# Patient Record
Sex: Male | Born: 1985
Health system: Southern US, Community
[De-identification: ages and names within clinical notes are randomized; demographics above are authoritative.]

## PROBLEM LIST (undated history)

## (undated) DIAGNOSIS — F319 Bipolar disorder, unspecified: Secondary | ICD-10-CM

## (undated) DIAGNOSIS — R51 Headache: Secondary | ICD-10-CM

## (undated) DIAGNOSIS — F419 Anxiety disorder, unspecified: Secondary | ICD-10-CM

## (undated) DIAGNOSIS — K118 Other diseases of salivary glands: Secondary | ICD-10-CM

## (undated) DIAGNOSIS — F429 Obsessive-compulsive disorder, unspecified: Secondary | ICD-10-CM

## (undated) DIAGNOSIS — L723 Sebaceous cyst: Secondary | ICD-10-CM

## (undated) DIAGNOSIS — F32A Depression, unspecified: Secondary | ICD-10-CM

## (undated) DIAGNOSIS — F329 Major depressive disorder, single episode, unspecified: Secondary | ICD-10-CM

## (undated) HISTORY — DX: Sebaceous cyst: L72.3

## (undated) HISTORY — PX: WISDOM TOOTH EXTRACTION: SHX21

## (undated) HISTORY — PX: OTHER SURGICAL HISTORY: SHX169

## (undated) HISTORY — DX: Headache: R51

## (undated) HISTORY — DX: Other diseases of salivary glands: K11.8

---

## 2004-01-04 ENCOUNTER — Ambulatory Visit (HOSPITAL_BASED_OUTPATIENT_CLINIC_OR_DEPARTMENT_OTHER): Admission: RE | Admit: 2004-01-04 | Discharge: 2004-01-04 | Payer: Self-pay | Admitting: Emergency Medicine

## 2012-01-06 ENCOUNTER — Ambulatory Visit (INDEPENDENT_AMBULATORY_CARE_PROVIDER_SITE_OTHER): Payer: PRIVATE HEALTH INSURANCE | Admitting: Family Medicine

## 2012-01-06 VITALS — BP 127/86 | HR 67 | Temp 97.5°F | Resp 16 | Ht 71.0 in | Wt 183.0 lb

## 2012-01-06 DIAGNOSIS — R1110 Vomiting, unspecified: Secondary | ICD-10-CM

## 2012-01-06 DIAGNOSIS — M542 Cervicalgia: Secondary | ICD-10-CM

## 2012-01-06 DIAGNOSIS — G43709 Chronic migraine without aura, not intractable, without status migrainosus: Secondary | ICD-10-CM

## 2012-01-06 LAB — POCT CBC
Granulocyte percent: 77.1 %G (ref 37–80)
HCT, POC: 49.5 % (ref 43.5–53.7)
Hemoglobin: 16.9 g/dL (ref 14.1–18.1)
Lymph, poc: 1.7 (ref 0.6–3.4)
MCH, POC: 30.5 pg (ref 27–31.2)
MCHC: 34.1 g/dL (ref 31.8–35.4)
MCV: 89.3 fL (ref 80–97)
MID (cbc): 0.3 (ref 0–0.9)
MPV: 10.3 fL (ref 0–99.8)
POC Granulocyte: 6.8 (ref 2–6.9)
POC LYMPH PERCENT: 19.2 %L (ref 10–50)
POC MID %: 3.7 %M (ref 0–12)
Platelet Count, POC: 289 10*3/uL (ref 142–424)
RBC: 5.54 M/uL (ref 4.69–6.13)
RDW, POC: 13.1 %
WBC: 8.8 10*3/uL (ref 4.6–10.2)

## 2012-01-06 LAB — COMPREHENSIVE METABOLIC PANEL
ALT: 26 U/L (ref 0–53)
AST: 22 U/L (ref 0–37)
Albumin: 4.9 g/dL (ref 3.5–5.2)
Alkaline Phosphatase: 85 U/L (ref 39–117)
BUN: 9 mg/dL (ref 6–23)
CO2: 26 mEq/L (ref 19–32)
Calcium: 9.8 mg/dL (ref 8.4–10.5)
Chloride: 106 mEq/L (ref 96–112)
Creat: 0.85 mg/dL (ref 0.50–1.35)
Glucose, Bld: 107 mg/dL — ABNORMAL HIGH (ref 70–99)
Potassium: 4 mEq/L (ref 3.5–5.3)
Sodium: 141 mEq/L (ref 135–145)
Total Bilirubin: 0.8 mg/dL (ref 0.3–1.2)
Total Protein: 7.5 g/dL (ref 6.0–8.3)

## 2012-01-06 LAB — POCT SEDIMENTATION RATE: POCT SED RATE: 5 mm/hr (ref 0–22)

## 2012-01-06 MED ORDER — SODIUM CHLORIDE 0.9 % IV SOLN: 4.0000 mg | Freq: Once | INTRAVENOUS | Status: DC

## 2012-01-06 MED ORDER — CYCLOBENZAPRINE HCL 10 MG PO TABS: 10.0000 mg | ORAL_TABLET | Freq: Three times a day (TID) | ORAL | Status: AC | PRN

## 2012-01-06 MED ORDER — ONDANSETRON 8 MG PO TBDP: 8.0000 mg | ORAL_TABLET | Freq: Three times a day (TID) | ORAL | Status: AC | PRN

## 2012-01-06 MED ORDER — KETOROLAC TROMETHAMINE 60 MG/2ML IM SOLN
60.0000 mg | Freq: Once | INTRAMUSCULAR | Status: AC
  Administered 2012-01-06: 60 mg via INTRAMUSCULAR

## 2012-01-06 NOTE — Progress Notes (Signed)
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 He comes in with his wife.  Patient has complaints of vomiting following an acute left neck pain. This began at 4:30 this morning. He's had this problem before beginning about 2 years ago. Last year occurred about every other month and he saw an Asian neurologist at [facility] who said there was nothing but muscle relaxers to be done for this. He's never had any imaging of the area.  Patient denies fever or stiff neck. Nevertheless he's had intractable vomiting every 15-20 minutes since 4:30 AM today. He's able to move his neck reasonably well but it's very sore in the left trapezius area. He says he has some ringing in his ears occasionally but no pain. He's had no loss of hearing. He's had no diplopia.  Objective: Patient appears acutely ill but is alert and cooperative.  Neck is supple without adenopathy or thyromegaly.  Head and neck exam: Large cystic structure anterior to the left tragus. Oropharynx clear, pupils equal and reactive to light and accommodation, TMs are normal and extraocular motion is intact.  Chest is clear  Heart: Regular no murmur  Abdomen: Soft nontender  Neurological: Mental status normal, cranial nerves III through XII intact  Patient moving 4 extremities equally  Skin: No rashes noted  Extremities: No deformities and no edema  The patient improved significantly during this visit after being given Toradol, Zofran, and 2 L of IV fluids. Observed for over an hour.  Assessment: Traumatic left neck pain with vomiting syndrome which has been quite troublesome in the past but fortunately not recurring this year until now. This may represent a migraine, but it's unclear if there may be a neck problem as well.  Plan:  Follow up to evaluate for neck problems Flexeril prn, zofran prn

## 2012-01-06 NOTE — Patient Instructions (Signed)
Nausea and Vomiting  Nausea is a sick feeling that often comes before throwing up (vomiting). Vomiting is a reflex where stomach contents come out of your mouth. Vomiting can cause severe loss of body fluids (dehydration). Children and elderly adults can become dehydrated quickly, especially if they also have diarrhea. Nausea and vomiting are symptoms of a condition or disease. It is important to find the cause of your symptoms.  CAUSES    Direct irritation of the stomach lining. This irritation can result from increased acid production (gastroesophageal reflux disease), infection, food poisoning, taking certain medicines (such as nonsteroidal anti-inflammatory drugs), alcohol use, or tobacco use.   Signals from the brain.These signals could be caused by a headache, heat exposure, an inner ear disturbance, increased pressure in the brain from injury, infection, a tumor, or a concussion, pain, emotional stimulus, or metabolic problems.   An obstruction in the gastrointestinal tract (bowel obstruction).   Illnesses such as diabetes, hepatitis, gallbladder problems, appendicitis, kidney problems, cancer, sepsis, atypical symptoms of a heart attack, or eating disorders.   Medical treatments such as chemotherapy and radiation.   Receiving medicine that makes you sleep (general anesthetic) during surgery.  DIAGNOSIS  Your caregiver may ask for tests to be done if the problems do not improve after a few days. Tests may also be done if symptoms are severe or if the reason for the nausea and vomiting is not clear. Tests may include:   Urine tests.   Blood tests.   Stool tests.   Cultures (to look for evidence of infection).   X-rays or other imaging studies.  Test results can help your caregiver make decisions about treatment or the need for additional tests.  TREATMENT  You need to stay well hydrated. Drink frequently but in small amounts.You may wish to drink water, sports drinks, clear broth, or eat frozen  ice pops or gelatin dessert to help stay hydrated.When you eat, eating slowly may help prevent nausea.There are also some antinausea medicines that may help prevent nausea.  HOME CARE INSTRUCTIONS    Take all medicine as directed by your caregiver.   If you do not have an appetite, do not force yourself to eat. However, you must continue to drink fluids.   If you have an appetite, eat a normal diet unless your caregiver tells you differently.   Eat a variety of complex carbohydrates (rice, wheat, potatoes, bread), lean meats, yogurt, fruits, and vegetables.   Avoid high-fat foods because they are more difficult to digest.   Drink enough water and fluids to keep your urine clear or pale yellow.   If you are dehydrated, ask your caregiver for specific rehydration instructions. Signs of dehydration may include:   Severe thirst.   Dry lips and mouth.   Dizziness.   Dark urine.   Decreasing urine frequency and amount.   Confusion.   Rapid breathing or pulse.  SEEK IMMEDIATE MEDICAL CARE IF:    You have blood or brown flecks (like coffee grounds) in your vomit.   You have black or bloody stools.   You have a severe headache or stiff neck.   You are confused.   You have severe abdominal pain.   You have chest pain or trouble breathing.   You do not urinate at least once every 8 hours.   You develop cold or clammy skin.   You continue to vomit for longer than 24 to 48 hours.   You have a fever.  MAKE SURE YOU:      Understand these instructions.   Will watch your condition.   Will get help right away if you are not doing well or get worse.  Document Released: 08/03/2005 Document Revised: 07/23/2011 Document Reviewed: 12/31/2010  [employer] Patient Information 2012 [employer].

## 2012-01-07 ENCOUNTER — Encounter: Payer: Self-pay | Admitting: *Deleted

## 2012-01-07 NOTE — Progress Notes (Signed)
Pt requests to walk in next week to see Dr. Elbert Ewings.

## 2012-03-15 ENCOUNTER — Encounter: Payer: Self-pay | Admitting: Family Medicine

## 2012-03-15 ENCOUNTER — Ambulatory Visit: Payer: PRIVATE HEALTH INSURANCE

## 2012-03-15 ENCOUNTER — Ambulatory Visit (INDEPENDENT_AMBULATORY_CARE_PROVIDER_SITE_OTHER): Payer: PRIVATE HEALTH INSURANCE | Admitting: Family Medicine

## 2012-03-15 VITALS — BP 120/80 | HR 60 | Temp 98.7°F | Resp 16 | Ht 70.0 in | Wt 188.0 lb

## 2012-03-15 DIAGNOSIS — M531 Cervicobrachial syndrome: Secondary | ICD-10-CM

## 2012-03-15 DIAGNOSIS — M5481 Occipital neuralgia: Secondary | ICD-10-CM

## 2012-03-15 DIAGNOSIS — R112 Nausea with vomiting, unspecified: Secondary | ICD-10-CM

## 2012-03-15 DIAGNOSIS — L723 Sebaceous cyst: Secondary | ICD-10-CM

## 2012-03-15 LAB — POCT SEDIMENTATION RATE: POCT SED RATE: 16 mm/hr (ref 0–22)

## 2012-03-15 LAB — CBC WITH DIFFERENTIAL/PLATELET
Basophils Absolute: 0 10*3/uL (ref 0.0–0.1)
Basophils Relative: 1 % (ref 0–1)
Eosinophils Absolute: 0.3 10*3/uL (ref 0.0–0.7)
Eosinophils Relative: 3 % (ref 0–5)
HCT: 44.5 % (ref 39.0–52.0)
Hemoglobin: 16.4 g/dL (ref 13.0–17.0)
Lymphocytes Relative: 37 % (ref 12–46)
Lymphs Abs: 3.3 10*3/uL (ref 0.7–4.0)
MCH: 31 pg (ref 26.0–34.0)
MCHC: 36.9 g/dL — ABNORMAL HIGH (ref 30.0–36.0)
MCV: 84.1 fL (ref 78.0–100.0)
Monocytes Absolute: 0.6 10*3/uL (ref 0.1–1.0)
Monocytes Relative: 7 % (ref 3–12)
Neutro Abs: 4.6 10*3/uL (ref 1.7–7.7)
Neutrophils Relative %: 52 % (ref 43–77)
Platelets: 257 10*3/uL (ref 150–400)
RBC: 5.29 MIL/uL (ref 4.22–5.81)
RDW: 13.2 % (ref 11.5–15.5)
WBC: 8.8 10*3/uL (ref 4.0–10.5)

## 2012-03-15 LAB — COMPREHENSIVE METABOLIC PANEL
ALT: 37 U/L (ref 0–53)
AST: 28 U/L (ref 0–37)
Albumin: 4.8 g/dL (ref 3.5–5.2)
Alkaline Phosphatase: 81 U/L (ref 39–117)
BUN: 10 mg/dL (ref 6–23)
CO2: 30 mEq/L (ref 19–32)
Calcium: 9.7 mg/dL (ref 8.4–10.5)
Chloride: 102 mEq/L (ref 96–112)
Creat: 0.93 mg/dL (ref 0.50–1.35)
Glucose, Bld: 83 mg/dL (ref 70–99)
Potassium: 4 mEq/L (ref 3.5–5.3)
Sodium: 138 mEq/L (ref 135–145)
Total Bilirubin: 0.6 mg/dL (ref 0.3–1.2)
Total Protein: 7.4 g/dL (ref 6.0–8.3)

## 2012-03-15 MED ORDER — CITALOPRAM HYDROBROMIDE 20 MG PO TABS: 20.0000 mg | ORAL_TABLET | Freq: Every day | ORAL | Status: DC

## 2012-03-15 NOTE — Progress Notes (Signed)
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  No h/o trauma.  Also, he awakens with am nausea and some dry heaves most mornings.  He has a lifelong h/o car sickness.  No weight loss.  This nausea doesn't seem to occur as much on Weekends.  Work is very stressful, as he is on productivity.  As a child, he was taking lithium and stopped once he reached the age of maturity.  Also, he has a chronic left preauricular cyst.  It has gradually been enlarging  Objective:  NAD HEENT:  Normal except for the 2 to 3 cm cyst anterior to left tragus Gait: normal Neck:  Normal ROM without adenopathy or pain Neuro: normal mental status (seen with wife), cranial nerves, motor intact [facility] reading (PRIMARY) by  Dr. Milus Glazier c-spine. negative   Assessment:  Significant stress, occipital neuralgia  Plan:   1. Cervico-occipital neuralgia  DG Cervical Spine Complete, citalopram (CELEXA) 20 MG tablet  2. Nausea & vomiting  POCT SEDIMENTATION RATE, Comprehensive metabolic panel, CBC with Differential, citalopram (CELEXA) 20 MG tablet  3. Sebaceous cyst  Ambulatory referral to ENT   Results for orders placed in visit on 03/15/12  POCT SEDIMENTATION RATE      Component Value Range   POCT SED RATE 16  0 - 22 mm/hr  COMPREHENSIVE METABOLIC PANEL      Component Value Range   Sodium 138  135 - 145 mEq/L   Potassium 4.0  3.5 - 5.3 mEq/L   Chloride 102  96 - 112 mEq/L   CO2 30  19 - 32 mEq/L   Glucose, Bld 83  70 - 99 mg/dL   BUN 10  6 - 23 mg/dL   Creat 0.27  2.53 - 6.64 mg/dL   Total Bilirubin 0.6  0.3 - 1.2 mg/dL   Alkaline Phosphatase 81  39 - 117 U/L   AST 28  0 - 37 U/L   ALT 37  0 - 53 U/L   Total Protein 7.4  6.0 - 8.3 g/dL   Albumin 4.8  3.5 - 5.2 g/dL   Calcium 9.7  8.4 - 40.3 mg/dL  CBC WITH DIFFERENTIAL      Component Value Range   WBC 8.8  4.0 - 10.5 K/uL   RBC 5.29  4.22  - 5.81 MIL/uL   Hemoglobin 16.4  13.0 - 17.0 g/dL   HCT 47.4  25.9 - 56.3 %   MCV 84.1  78.0 - 100.0 fL   MCH 31.0  26.0 - 34.0 pg   MCHC 36.9 (*) 30.0 - 36.0 g/dL   RDW 87.5  64.3 - 32.9 %   Platelets 257  150 - 400 K/uL   Neutrophils Relative 52  43 - 77 %   Neutro Abs 4.6  1.7 - 7.7 K/uL   Lymphocytes Relative 37  12 - 46 %   Lymphs Abs 3.3  0.7 - 4.0 K/uL   Monocytes Relative 7  3 - 12 %   Monocytes Absolute 0.6  0.1 - 1.0 K/uL   Eosinophils Relative 3  0 - 5 %   Eosinophils Absolute 0.3  0.0 - 0.7 K/uL   Basophils Relative 1  0 - 1 %   Basophils Absolute 0.0  0.0 - 0.1 K/uL   Smear Review Criteria for review not met

## 2012-03-25 ENCOUNTER — Other Ambulatory Visit: Payer: Self-pay | Admitting: Otolaryngology

## 2012-03-25 DIAGNOSIS — R220 Localized swelling, mass and lump, head: Secondary | ICD-10-CM

## 2012-03-29 ENCOUNTER — Ambulatory Visit
Admission: RE | Admit: 2012-03-29 | Discharge: 2012-03-29 | Disposition: A | Discharge status: Home / Self Care | Payer: PRIVATE HEALTH INSURANCE | Source: Ambulatory Visit | Attending: Otolaryngology | Admitting: Otolaryngology

## 2012-03-29 DIAGNOSIS — R221 Localized swelling, mass and lump, neck: Secondary | ICD-10-CM

## 2012-03-29 MED ORDER — IOHEXOL 300 MG/ML  SOLN
75.0000 mL | Freq: Once | INTRAMUSCULAR | Status: AC | PRN
  Administered 2012-03-29: 75 mL via INTRAVENOUS

## 2012-04-28 ENCOUNTER — Ambulatory Visit (INDEPENDENT_AMBULATORY_CARE_PROVIDER_SITE_OTHER): Payer: PRIVATE HEALTH INSURANCE | Admitting: Family Medicine

## 2012-04-28 ENCOUNTER — Encounter: Payer: Self-pay | Admitting: Family Medicine

## 2012-04-28 VITALS — BP 118/90 | HR 53 | Temp 98.5°F | Resp 16 | Ht 70.0 in | Wt 194.0 lb

## 2012-04-28 DIAGNOSIS — F4541 Pain disorder exclusively related to psychological factors: Secondary | ICD-10-CM

## 2012-04-28 DIAGNOSIS — R51 Headache: Secondary | ICD-10-CM

## 2012-04-28 DIAGNOSIS — G4700 Insomnia, unspecified: Secondary | ICD-10-CM

## 2012-04-28 MED ORDER — TRIAZOLAM 0.25 MG PO TABS: 0.2500 mg | ORAL_TABLET | Freq: Every evening | ORAL | Status: DC | PRN

## 2012-04-28 NOTE — Patient Instructions (Addendum)

## 2012-04-28 NOTE — Progress Notes (Signed)
26 yo with occipital and neck pain, here for follow up.  The citalopram has blunted his libido.  Sleeping better, but not great Works at [employer] of outpatient surgery for pretragal cyst next week  Generally, the headaches have been worse in the winter time.  Main issue now is getting enough sleep.  Objective:  NAD Heent:  Left pretragal cyst, otherwise no problem with hearing, TM's, oroph Eyes:  Normal fundi, EOMI Neck:  Supple, no adenopathy Chest:  Clear Heart:  Regular, no murmur  Assessment:  Insomnia continues to be a problem, headaches under control, about to have surgery for the left pretragal cyst  Plan:  Stop the citalopram and prescribe Halcion 1. Stress headaches  triazolam (HALCION) 0.25 MG tablet  2. Insomnia  triazolam (HALCION) 0.25 MG tablet

## 2012-05-05 ENCOUNTER — Other Ambulatory Visit: Payer: Self-pay | Admitting: Otolaryngology

## 2012-07-10 ENCOUNTER — Encounter: Payer: Self-pay | Admitting: Family Medicine

## 2012-07-10 DIAGNOSIS — L723 Sebaceous cyst: Secondary | ICD-10-CM | POA: Insufficient documentation

## 2012-08-11 ENCOUNTER — Encounter: Payer: Self-pay | Admitting: *Deleted

## 2012-08-29 ENCOUNTER — Telehealth: Payer: Self-pay

## 2012-08-29 NOTE — Telephone Encounter (Signed)
Pt is requesting migraine medicaions - [facility] [address]

## 2012-08-30 MED ORDER — CYCLOBENZAPRINE HCL 10 MG PO TABS: 10.0000 mg | ORAL_TABLET | Freq: Two times a day (BID) | ORAL | Status: AC | PRN

## 2012-08-30 NOTE — Telephone Encounter (Signed)
Spoke to patient he is requesting flexeril for this please advise. States neck pain causing headaches and this medication has been helpful. Taking prn. Pended.

## 2012-08-30 NOTE — Telephone Encounter (Signed)
Sent flexeril

## 2012-08-30 NOTE — Telephone Encounter (Signed)
Notified pt rx was sent in ?

## 2015-02-03 ENCOUNTER — Emergency Department (HOSPITAL_COMMUNITY)
Admission: EM | Admit: 2015-02-03 | Discharge: 2015-02-03 | Disposition: A | Discharge status: Home / Self Care | Payer: 59 | Attending: Emergency Medicine | Admitting: Emergency Medicine

## 2015-02-03 DIAGNOSIS — R252 Cramp and spasm: Secondary | ICD-10-CM | POA: Diagnosis present

## 2015-02-03 DIAGNOSIS — Z79899 Other long term (current) drug therapy: Secondary | ICD-10-CM | POA: Insufficient documentation

## 2015-02-03 DIAGNOSIS — G43009 Migraine without aura, not intractable, without status migrainosus: Secondary | ICD-10-CM

## 2015-02-03 DIAGNOSIS — G43809 Other migraine, not intractable, without status migrainosus: Secondary | ICD-10-CM | POA: Insufficient documentation

## 2015-02-03 DIAGNOSIS — Z87891 Personal history of nicotine dependence: Secondary | ICD-10-CM | POA: Diagnosis not present

## 2015-02-03 DIAGNOSIS — M542 Cervicalgia: Secondary | ICD-10-CM | POA: Insufficient documentation

## 2015-02-03 DIAGNOSIS — Z872 Personal history of diseases of the skin and subcutaneous tissue: Secondary | ICD-10-CM | POA: Diagnosis not present

## 2015-02-03 LAB — I-STAT CHEM 8, ED
BUN: 10 mg/dL (ref 6–20)
Calcium, Ion: 1.19 mmol/L (ref 1.12–1.23)
Chloride: 108 mmol/L (ref 101–111)
Creatinine, Ser: 0.9 mg/dL (ref 0.61–1.24)
Glucose, Bld: 122 mg/dL — ABNORMAL HIGH (ref 65–99)
HEMATOCRIT: 47 % (ref 39.0–52.0)
Hemoglobin: 16 g/dL (ref 13.0–17.0)
Potassium: 3.6 mmol/L (ref 3.5–5.1)
SODIUM: 144 mmol/L (ref 135–145)
TCO2: 23 mmol/L (ref 0–100)

## 2015-02-03 LAB — HEPATIC FUNCTION PANEL
ALT: 19 U/L (ref 17–63)
AST: 23 U/L (ref 15–41)
Albumin: 4.6 g/dL (ref 3.5–5.0)
Alkaline Phosphatase: 80 U/L (ref 38–126)
Bilirubin, Direct: <0.1 mg/dL — ABNORMAL LOW (ref 0.1–0.5)
Total Bilirubin: 0.8 mg/dL (ref 0.3–1.2)
Total Protein: 7.8 g/dL (ref 6.5–8.1)

## 2015-02-03 LAB — CBC WITH DIFFERENTIAL/PLATELET
Basophils Absolute: 0 10*3/uL (ref 0.0–0.1)
Basophils Relative: 0 % (ref 0–1)
Eosinophils Absolute: 0 10*3/uL (ref 0.0–0.7)
Eosinophils Relative: 0 % (ref 0–5)
HCT: 44.7 % (ref 39.0–52.0)
Hemoglobin: 15.7 g/dL (ref 13.0–17.0)
Lymphocytes Relative: 10 % — ABNORMAL LOW (ref 12–46)
Lymphs Abs: 1.4 10*3/uL (ref 0.7–4.0)
MCH: 30.9 pg (ref 26.0–34.0)
MCHC: 35.1 g/dL (ref 30.0–36.0)
MCV: 88 fL (ref 78.0–100.0)
MONOS PCT: 2 % — AB (ref 3–12)
Monocytes Absolute: 0.4 10*3/uL (ref 0.1–1.0)
NEUTROS ABS: 12.9 10*3/uL — AB (ref 1.7–7.7)
NEUTROS PCT: 88 % — AB (ref 43–77)
Platelets: 259 10*3/uL (ref 150–400)
RBC: 5.08 MIL/uL (ref 4.22–5.81)
RDW: 12.4 % (ref 11.5–15.5)
WBC: 14.7 10*3/uL — ABNORMAL HIGH (ref 4.0–10.5)

## 2015-02-03 LAB — I-STAT CG4 LACTIC ACID, ED: LACTIC ACID, VENOUS: 1.76 mmol/L (ref 0.5–2.0)

## 2015-02-03 LAB — CK: CK TOTAL: 116 U/L (ref 49–397)

## 2015-02-03 LAB — LIPASE, BLOOD: LIPASE: 15 U/L — AB (ref 22–51)

## 2015-02-03 MED ORDER — ONDANSETRON HCL 4 MG/2ML IJ SOLN
4.0000 mg | Freq: Once | INTRAMUSCULAR | Status: AC
  Administered 2015-02-03: 4 mg via INTRAVENOUS

## 2015-02-03 MED ORDER — SODIUM CHLORIDE 0.9 % IV BOLUS (SEPSIS)
1000.0000 mL | Freq: Once | INTRAVENOUS | Status: AC
  Administered 2015-02-03: 1000 mL via INTRAVENOUS

## 2015-02-03 MED ORDER — PROCHLORPERAZINE EDISYLATE 5 MG/ML IJ SOLN
10.0000 mg | Freq: Once | INTRAMUSCULAR | Status: AC
Start: 2015-02-03 — End: 2015-02-03
  Administered 2015-02-03: 10 mg via INTRAVENOUS
  Filled 2015-02-03: qty 2

## 2015-02-03 MED ORDER — SODIUM CHLORIDE 0.9 % IV BOLUS (SEPSIS)
1000.0000 mL | Freq: Once | INTRAVENOUS | Status: AC
Start: 2015-02-03 — End: 2015-02-03
  Administered 2015-02-03: 1000 mL via INTRAVENOUS

## 2015-02-03 MED ORDER — KETOROLAC TROMETHAMINE 30 MG/ML IJ SOLN
30.0000 mg | Freq: Once | INTRAMUSCULAR | Status: AC
  Administered 2015-02-03: 30 mg via INTRAVENOUS
  Filled 2015-02-03: qty 1

## 2015-02-03 MED ORDER — LORAZEPAM 2 MG/ML IJ SOLN
1.0000 mg | Freq: Once | INTRAMUSCULAR | Status: AC
  Administered 2015-02-03: 1 mg via INTRAVENOUS
  Filled 2015-02-03: qty 1

## 2015-02-03 MED ORDER — ONDANSETRON HCL 4 MG PO TABS: 4.0000 mg | ORAL_TABLET | Freq: Three times a day (TID) | ORAL | Status: DC | PRN

## 2015-02-03 NOTE — Discharge Instructions (Signed)
Migraine Headache A migraine headache is an intense, throbbing pain on one or both sides of your head. A migraine can last for 30 minutes to several hours. CAUSES  The exact cause of a migraine headache is not always known. However, a migraine may be caused when nerves in the brain become irritated and release chemicals that cause inflammation. This causes pain. Certain things may also trigger migraines, such as:  Alcohol.  Smoking.  Stress.  Menstruation.  Aged cheeses.  Foods or drinks that contain nitrates, glutamate, aspartame, or tyramine.  Lack of sleep.  Chocolate.  Caffeine.  Hunger.  Physical exertion.  Fatigue.  Medicines used to treat chest pain (nitroglycerine), birth control pills, estrogen, and some blood pressure medicines. SIGNS AND SYMPTOMS  Pain on one or both sides of your head.  Pulsating or throbbing pain.  Severe pain that prevents daily activities.  Pain that is aggravated by any physical activity.  Nausea, vomiting, or both.  Dizziness.  Pain with exposure to bright lights, loud noises, or activity.  General sensitivity to bright lights, loud noises, or smells. Before you get a migraine, you may get warning signs that a migraine is coming (aura). An aura may include:  Seeing flashing lights.  Seeing bright spots, halos, or zigzag lines.  Having tunnel vision or blurred vision.  Having feelings of numbness or tingling.  Having trouble talking.  Having muscle weakness. DIAGNOSIS  A migraine headache is often diagnosed based on:  Symptoms.  Physical exam.  A CT scan or MRI of your head. These imaging tests cannot diagnose migraines, but they can help rule out other causes of headaches. TREATMENT Medicines may be given for pain and nausea. Medicines can also be given to help prevent recurrent migraines.  HOME CARE INSTRUCTIONS  Only take over-the-counter or prescription medicines for pain or discomfort as directed by your  health care provider. The use of long-term narcotics is not recommended.  Lie down in a dark, quiet room when you have a migraine.  Keep a journal to find out what may trigger your migraine headaches. For example, write down:  What you eat and drink.  How much sleep you get.  Any change to your diet or medicines.  Limit alcohol consumption.  Quit smoking if you smoke.  Get 7-9 hours of sleep, or as recommended by your health care provider.  Limit stress.  Keep lights dim if bright lights bother you and make your migraines worse. SEEK IMMEDIATE MEDICAL CARE IF:   Your migraine becomes severe.  You have a fever.  You have a stiff neck.  You have vision loss.  You have muscular weakness or loss of muscle control.  You start losing your balance or have trouble walking.  You feel faint or pass out.  You have severe symptoms that are different from your first symptoms. MAKE SURE YOU:   Understand these instructions.  Will watch your condition.  Will get help right away if you are not doing well or get worse. Document Released: 08/03/2005 Document Revised: 12/18/2013 Document Reviewed: 04/10/2013 [facility] Patient Information 2015 [place], [place]. This information is not intended to replace advice given to you by your health care provider. Make sure you discuss any questions you have with your health care provider.   Emergency Department Resource Guide 1) Find a Doctor and Pay Out of Pocket Although you won't have to find out who is covered by your insurance plan, it is a good idea to ask around and get recommendations.  You will then need to call the office and see if the doctor you have chosen will accept you as a new patient and what types of options they offer for patients who are self-pay. Some doctors offer discounts or will set up payment plans for their patients who do not have insurance, but you will need to ask so you aren't surprised when you get to your  appointment.  2) Contact Your Local Health Department Not all health departments have doctors that can see patients for sick visits, but many do, so it is worth a call to see if yours does. If you don't know where your local health department is, you can check in your phone book. The CDC also has a tool to help you locate your state's health department, and many state websites also have listings of all of their local health departments.  3) Find a [facility] If your illness is not likely to be very severe or complicated, you may want to try a walk in clinic. These are popping up all over the country in pharmacies, drugstores, and shopping centers. They're usually staffed by nurse practitioners or physician assistants that have been trained to treat common illnesses and complaints. They're usually fairly quick and inexpensive. However, if you have serious medical issues or chronic medical problems, these are probably not your best option.  No Primary Care Doctor: - Call Health Connect at  [phone] - they can help you locate a primary care doctor that  accepts your insurance, provides certain services, etc. - Physician Referral Service- [phone]  Chronic Pain Problems: Organization         Address  Phone   Notes  [facility]  [phone] Patients need to be referred by their primary care doctor.   Medication Assistance: Organization         Address  Phone   Notes  [facility] Medication [facility] [place] [place], [place] [address] [phone] --Must be a resident of [facility] -- Must have NO insurance coverage whatsoever (no Medicaid/ Medicare, etc.) -- The pt. MUST have a primary care doctor that directs their care regularly and follows them in the community   [facility]  [phone]   [employer]  [phone]    Agencies that provide inexpensive medical care: Organization         Address  Phone   Notes  [place]  [phone]   [facility]    [phone]   [facility] [place], [place] [address] [phone]   [place] [address] [place] [phone]   Planned Parenthood    [phone]   [facility]    [phone]   [facility] [address], [place] Phone:  [phone], Fax:  [phone] Hours of Operation:  9 am - 6 pm, M-F.  Also accepts Medicaid/Medicare and self-pay.  [facility] for [place], [address], [place] Phone: [phone], Fax: [phone]. Hours of Operation:  8:30 am - 5:30 pm, M-F.  Also accepts Medicaid and self-pay.  [facility] [place] [address] [place] Phone: [phone]   [place], [place], [place] [phone], Ext. [phone] Mondays & Thursdays: 7-9 AM.  First 15 patients are seen on a first come,  first serve basis.    [place] Providers:  Organization         Address  Phone   Notes  [facility] [address], [place] [phone] Also accepts self-pay patients.  [facility] [address], [place]  [phone]   [place], [address], [place] [phone]   [facility] [address], [place] [phone]   Lucianne Lei [address], [place]   [phone] Only accepts [place] Access [place] patients after they have their name applied to their card.   Self-Pay (no insurance) in [facility]:  Organization         Address  Phone   Notes  Sickle Cell Patients, [facility] [place] [phone]   [facility] [place] [phone]   [facility]  [place]  [place], [place], [place] [phone]   [facility]/Dr. Osei-Bonsu  [address] [place] or [address], [place] [phone] Phone number for both Center and [place] locations is the same.  [facility] [address], [place] [phone]   [facility] [address] [place] or [address] [phone]   [facility] [address] [place] [phone], phone; [phone], fax Sees patients 1st and 3rd Saturday of every month.  Must not qualify for public or private insurance (i.e. Medicaid, Medicare, [place] Health Choice, Veterans' Benefits)  Household income should be no more than 200% of the poverty level The clinic cannot treat you if you are pregnant or think you are pregnant  Sexually transmitted diseases are not treated at the clinic.    Dental Care: Organization         Address  Phone  Notes  [facility] [place] [phone] Accepts children up to age 34 who are enrolled in [place] or [place]; pregnant women with a Medicaid card; and children who have applied for Medicaid or [place] Health Choice, but were declined, whose parents can pay a reduced fee at time of service.  [facility] Department of [facility]  [address], [place] [phone] Accepts children up to age 53 who are enrolled in [place] or [place]; pregnant women with a Medicaid card; and children who have applied for Medicaid or [place] Health Choice, but were declined, whose parents can pay a reduced fee at time of service.  [place] [facility] Access PRO[facility]  [place] [phone] Patients are seen by appointment only. Walk-ins are not accepted. [place] will see patients 11 years of age and  older. Monday - Tuesday (8am-5pm) Most Wednesdays (8:30-5pm) $30 per visit, cash only  [facility] Adult Dental Access PROGRAM  [address], [facility] [phone] Patients are seen by appointment only. Walk-ins are not accepted. [place] will see patients 53 years of age and older. One Wednesday Evening (Monthly: Volunteer Based).  $30 per visit, cash only  [place]  [phone] for adults; Children under age 84, call Graduate Pediatric Dentistry at [phone]. Children aged 86-14, please  call [phone] to request a pediatric application.  Dental services are provided in all areas of dental care including fillings, crowns and bridges, complete and partial dentures, implants, gum treatment, root canals, and extractions. Preventive care is also provided. Treatment is provided to both adults and children. Patients are selected via a lottery and there is often a waiting list.   [facility] [address], [place]  [phone] [email or web address]   [facility] [address] [place], [place] [phone], Ext. [phone] Second and Fourth Thursday of each month, opens at 6:30 AM; Clinic ends at 9 AM.  Patients are seen on a first-come first-served basis, and a limited number are seen during each clinic.   [facility]  [address] [place] Danker [place], [place] [phone]   Eligibility Requirements You must have lived in [place], [place], or [place] counties for at least the last three months.   You cannot be eligible for state or federal sponsored [employer], including [employer], [place], or [employer].   You generally cannot be eligible for healthcare insurance through your employer.    How to apply: Eligibility screenings are held every Tuesday and Wednesday afternoon from 1:00 pm until 4:00 pm. You do not need an appointment for the interview!  [facility] [address]  [place], [place]   [place]  [place] Department  [place]  [phone]    Behavioral Health Resources in the Community: Intensive Outpatient Programs Organization         Address  Phone  Notes  [place] [employer] [address] [place], [place] [phone]   [facility] [address], [place], [place]   ADS: [facility] [address] [place], [place]   [place] [address]  [place], [place] or [phone]   Substance Abuse Resources Organization         Address  Phone  Notes  Alcohol and Drug Services  [phone]   [place]  [phone]   [facility] [place]   Chinita Pester  [phone]   Residential & Outpatient Substance Abuse Program  [phone]   Psychological Services Organization         Address  Phone  Notes  [facility] [place]  [place]  [phone]   [place] [address], [place] or [phone]    Mobile Crisis Teams Organization         Address  Phone  Notes  [facility], Mobile Crisis Care Unit  [phone]   [facility]  [address] [place], [place]   [place] Levels [address] [place] [address] [place] [phone]    Self-Help/Support Groups Organization         Address  Phone             Notes  [place]. of [place] - variety of support groups  [place] Call for more information  Narcotics Anonymous (NA), [facility] [address], [employer] [place]  2 meetings at this location   Special educational needs teacher         Address  Phone  Notes  [facility] [place],    [place]  [phone]   [facility] ° [address], [place], [place] [phone]   °[facility] [address], [place] [phone] Admissions: 8am-3pm M-F  °[facility] [address]    °[place], [place] [phone]   °[facility] [address], [place], [place] [phone]   °[facility] [address]  °[place], [place] [phone]   °[facility] - Intensive Outpatient [address], [place], [place] [phone]   °[facility] [address]  °[place], [place] 1-877-615-2722 or [phone]   °[facility] ([facility]) [address] [place], [place] [phone] Accepts Medicaid  °[facility] [address]  °[place] 1-800-659-3381 Substance Abuse/Addiction Treatment  ° °[place] Behavioral Health Resources °Organization         Address  Phone  Notes  °[facility]  ([phone]) [phone]   °Julie Brannon, PhD [address] [place], [place]   ([phone]) [phone] or (336) 951-0000   °[facility]   [address] °[place], [place] ([phone]) [phone]   °[facility] [address], [place], [place] ([phone]) [phone] Insurance/Medicaid/sponsorship through [facility]  °[facility] [address]                                    [place], [place] [phone]) [phone] Therapy/tele-psych/case  °[facility] [address]  ° [place], [place] ([phone]) [phone]    °Dr. Arfeen  ([phone]) [phone]   °[facility] [place]  [employer] [place] [facility] 1) [address], [place] °2) [address], [place] °3)  [address], [place] ([phone]) [phone] °(336) [phone] ° °(336) [phone]   °[place] Child Abuse Hotline (336) 342-1394 or (336) 342-3537 (After Hours)    ° ° ° °

## 2015-02-03 NOTE — ED Notes (Addendum)
Muscle spasms in neck, triggered by stress, has been vomiting for a few hours today, d/t muscle spasms. Same has occurred in past. No recent injury.  Has not taken any flexeril today, unable to keep down d/t vomiting

## 2015-02-03 NOTE — ED Notes (Signed)
Whereas when I first saw him he was shaking, as if actively chilling and diaphoretic and a bit pale; he is now resting comfortably.  His skin is normal, warm and dry and he is breathing normally.  His wife is in constant attendance and thanks [place] for our care.

## 2015-02-03 NOTE — ED Notes (Signed)
Pt was able to dangle on the side of the bed then ambulate to the nursing station and back to bed w/o difficulty.  Denies nausea at this time and reports feeling better.

## 2015-02-03 NOTE — ED Provider Notes (Signed)
CSN: [id number]     Arrival date & time 02/03/15  1541 History   First MD Initiated Contact with Patient 02/03/15 1605     Chief Complaint  Patient presents with  . Spasms  . Emesis     (Consider location/radiation/quality/duration/timing/severity/associated sxs/prior Treatment) HPI   29 year old male with history of recurrent headache, neck spasm presenting for evaluation of muscle spasm. History obtained through wife who is at bedside. Per wife, patient has had intermittent episodes of violent muscle spasm usually triggered by stress. This is his third episodes, last episode was 6 months ago. Approximately 3 hours ago patient developed spasm to his left side of neck that has gotten progressively worse. Spasm became violent, he vomited several times and unable to keep this Flexeril down. Patient broke out into a sweat. He did not injure himself in the process. Wife states when he has his last episode he was treated at urgent care with IV medication. Wife felt stress may be related to getting married yesterday.  Denies drug use.  Prior to neck spasm pt has no other sxs including headache, neck pain, cp, sob, abd pain, numbness, weakness or rash.   Past Medical History  Diagnosis Date  . Headache   . Parotid mass   . Sebaceous cyst    Past Surgical History  Procedure Laterality Date  . Excision benign cyst     No family history on file. History  Substance Use Topics  . Smoking status: Former Smoker    Quit date: 01/05/2005  . Smokeless tobacco: Not on file  . Alcohol Use: Not on file    Review of Systems  All other systems reviewed and are negative.     Allergies  Review of patient's allergies indicates no known allergies.  Home Medications   Prior to Admission medications   Medication Sig Start Date End Date Taking? Authorizing Provider  cyclobenzaprine (FLEXERIL) 10 MG tablet Take 1 tablet (10 mg total) by mouth 2 (two) times daily as needed for muscle spasms. 08/30/12   Yes Eleanore Kurtis Bushman, PA-C  levocetirizine (XYZAL) 5 MG tablet Take 5 mg by mouth daily. 12/24/14  Yes Historical Provider, MD  citalopram (CELEXA) 20 MG tablet Take 1 tablet (20 mg total) by mouth daily. 03/15/12 03/15/13  Robyn Haber, MD  triazolam (HALCION) 0.25 MG tablet Take 1 tablet (0.25 mg total) by mouth at bedtime as needed. 04/28/12 05/28/12  Robyn Haber, MD   BP 110/90 mmHg  Pulse 84  Resp 20  SpO2 100% Physical Exam  Constitutional: He is oriented to person, place, and time. He appears well-developed and well-nourished. No distress.  Caucasian male, laying still with eyes closed, diaphoretic.  HENT:  Head: Normocephalic and atraumatic.  Right Ear: External ear normal.  Left Ear: External ear normal.  Mouth/Throat: Oropharynx is clear and moist.  Eyes: Conjunctivae and EOM are normal. Pupils are equal, round, and reactive to light.  Neck: Normal range of motion. Neck supple.  Tenderness to paracervical spine on palpation but no nuchal rigidity.  Cardiovascular: Normal rate, regular rhythm and intact distal pulses.   Pulmonary/Chest: Effort normal and breath sounds normal.  Abdominal: Soft. There is no tenderness.  Musculoskeletal:  Able to move all 4 extremities, normal grip strength.  Neurological: He is alert and oriented to person, place, and time.  Skin: No rash noted.  Psychiatric: He has a normal mood and affect.  Nursing note and vitals reviewed.   ED Course  Procedures (including critical care time)  Patient here with recurrent bouts of neck spasm usually induced by stress. He is diaphoretic, however no nuchal rigidity and has no obvious focal neuro deficit. Given the recurrent of his symptoms, we'll focus on treating his symptoms with Ativan, IV fluid, zofran and will monitor closely.    Doubt meningitis or stroke.  He has history of occipito-cervical neuralgia which sometimes treat his headache. This felt similar to one of his prior episodes.  7:03  PM Wife made mention that pt has been overdoing himself for the past week in preparation for the wedding and she believes he is crashing from it.  Pt appears exhausted.  He report neck soreness and migraine type headache.  He has no nuchal rigidity to suggest meningitis.  I also discussed further work up including labs and LP if no improvement, pt declined.  Did attempt to ambulate pt but he became nauseated.  Will give compazine and monitor closely.    8:57 PM Nausea improves, but pt is still drowsy.  Dr. Maryan Rued has seen and evaluate pt and felt this is likely an atypical migraine since pt has hx of migraine. Plan to give toradol and monitor.    11:10 PM Patient felt much better, able to ambulate without difficulty, no nausea. He felt comfortable going home. Will provide resources for outpatient follow-up.  Return precaution discussed. Labs are otherwise reassuring.    Labs Review Labs Reviewed  CBC WITH DIFFERENTIAL/PLATELET - Abnormal; Notable for the following:    WBC 14.7 (*)    Neutrophils Relative % 88 (*)    Neutro Abs 12.9 (*)    Lymphocytes Relative 10 (*)    Monocytes Relative 2 (*)    All other components within normal limits  LIPASE, BLOOD - Abnormal; Notable for the following:    Lipase 15 (*)    All other components within normal limits  HEPATIC FUNCTION PANEL - Abnormal; Notable for the following:    Bilirubin, Direct <0.1 (*)    All other components within normal limits  I-STAT CHEM 8, ED - Abnormal; Notable for the following:    Glucose, Bld 122 (*)    All other components within normal limits  CK  I-STAT CG4 LACTIC ACID, ED    Imaging Review No results found.   EKG Interpretation None      MDM   Final diagnoses:  Atypical migraine    BP 123/68 mmHg  Pulse 76  Temp(Src) 98.3 F (36.8 C) (Oral)  Resp 16  SpO2 97%  I have reviewed nursing notes and vital signs. I reviewed available ER/hospitalization records through the EMR     Domenic Moras,  PA-C 02/03/15 [place], MD 02/03/15 2333

## 2015-02-03 NOTE — ED Notes (Signed)
Pt refused to walk. Pt. Stated that he could not walk because he was feeling nausea. Nurse was notified.

## 2015-04-05 ENCOUNTER — Ambulatory Visit: Payer: PRIVATE HEALTH INSURANCE | Admitting: Family

## 2015-05-30 ENCOUNTER — Other Ambulatory Visit: Payer: Self-pay | Admitting: Neurology

## 2015-05-30 DIAGNOSIS — R51 Headache: Principal | ICD-10-CM

## 2015-05-30 DIAGNOSIS — R519 Headache, unspecified: Secondary | ICD-10-CM

## 2015-06-14 ENCOUNTER — Ambulatory Visit
Admission: RE | Admit: 2015-06-14 | Discharge: 2015-06-14 | Disposition: A | Discharge status: Home / Self Care | Payer: 59 | Source: Ambulatory Visit | Attending: Neurology | Admitting: Neurology

## 2015-06-14 DIAGNOSIS — R51 Headache: Principal | ICD-10-CM

## 2015-06-14 DIAGNOSIS — R519 Headache, unspecified: Secondary | ICD-10-CM

## 2015-07-05 ENCOUNTER — Other Ambulatory Visit: Payer: Self-pay | Admitting: Neurology

## 2015-07-05 MED ORDER — LEVOCETIRIZINE DIHYDROCHLORIDE 5 MG PO TABS: 5.0000 mg | ORAL_TABLET | Freq: Every day | ORAL | Status: DC

## 2015-09-23 ENCOUNTER — Encounter: Payer: Self-pay | Admitting: Allergy and Immunology

## 2015-09-23 ENCOUNTER — Ambulatory Visit (INDEPENDENT_AMBULATORY_CARE_PROVIDER_SITE_OTHER): Payer: 59 | Admitting: Allergy and Immunology

## 2015-09-23 VITALS — BP 120/90 | HR 65 | Temp 98.5°F | Resp 17

## 2015-09-23 DIAGNOSIS — J4530 Mild persistent asthma, uncomplicated: Secondary | ICD-10-CM | POA: Insufficient documentation

## 2015-09-23 DIAGNOSIS — J3089 Other allergic rhinitis: Secondary | ICD-10-CM

## 2015-09-23 MED ORDER — LEVOCETIRIZINE DIHYDROCHLORIDE 5 MG PO TABS: 5.0000 mg | ORAL_TABLET | Freq: Every day | ORAL | Status: DC

## 2015-09-23 MED ORDER — IPRATROPIUM-ALBUTEROL 0.5-2.5 (3) MG/3ML IN SOLN
3.0000 mL | Freq: Once | RESPIRATORY_TRACT | Status: AC
  Administered 2015-09-23: 3 mL via RESPIRATORY_TRACT

## 2015-09-23 MED ORDER — FLUTICASONE FUROATE 100 MCG/ACT IN AEPB: 1.0000 | INHALATION_SPRAY | Freq: Every day | RESPIRATORY_TRACT | Status: DC

## 2015-09-23 NOTE — Assessment & Plan Note (Signed)
Today's spirometry results, assessed while asymptomatic, suggest under-perception of dyspnea.  He is unable to take montelukast due to side effects.  A prescription has been provided for Arnuity Ellipta (fluticasone furoate) 100 g, one inhalation daily.  Continue albuterol HFA every or to 6 hours as needed.  Subjective and objective measures of pulmonary function will be followed and the treatment plan will be adjusted accordingly.

## 2015-09-23 NOTE — Patient Instructions (Addendum)
Mild persistent asthma Today's spirometry results, assessed while asymptomatic, suggest under-perception of dyspnea.  He is unable to take montelukast due to side effects.  A prescription has been provided for Arnuity Ellipta (fluticasone furoate) 100 g, one inhalation daily.  Continue albuterol HFA every or to 6 hours as needed.  Subjective and objective measures of pulmonary function will be followed and the treatment plan will be adjusted accordingly.  Allergic rhinitis  Continue appropriate allergen avoidance measures.  A refill prescription has been provided for levocetirizine 5 mg daily as needed.    Return in about 8 weeks (around 11/18/2015), or if symptoms worsen or fail to improve.

## 2015-09-23 NOTE — Assessment & Plan Note (Signed)
   Continue appropriate allergen avoidance measures.  A refill prescription has been provided for levocetirizine 5 mg daily as needed.

## 2015-09-23 NOTE — Progress Notes (Signed)
Follow-up Note  RE: BRAYDIN FLAMAND MRN: [id number] DOB: [birth year] Date of Office Visit: 09/23/2015  Primary care provider: Robyn Haber, MD Referring provider: Robyn Haber, MD  History of present illness: HPI Comments: Eric [name] is a 30 y.o. male with asthma and allergic rhinoconjunctivitis who presents today for follow up.  He reports that he discontinued Singulair after having taking it for a few months because he began to feel depressed and was worried that this was a medication side effect.  He reports that his upper and lower rest or symptoms had been well-controlled while on montelukast.  He ran out of levocetirizine approximately 2 or 3 weeks ago and has experienced increased rhinorrhea and nasal congestion.   Assessment and plan: Mild persistent asthma Today's spirometry results, assessed while asymptomatic, suggest under-perception of dyspnea.  He is unable to take montelukast due to side effects.  A prescription has been provided for Arnuity Ellipta (fluticasone furoate) 100 g, one inhalation daily.  Continue albuterol HFA every or to 6 hours as needed.  Subjective and objective measures of pulmonary function will be followed and the treatment plan will be adjusted accordingly.  Allergic rhinitis  Continue appropriate allergen avoidance measures.  A refill prescription has been provided for levocetirizine 5 mg daily as needed.    Meds ordered this encounter  Medications  . ipratropium-albuterol (DUONEB) 0.5-2.5 (3) MG/3ML nebulizer solution 3 mL    Sig:   . levocetirizine (XYZAL) 5 MG tablet    Sig: Take 1 tablet (5 mg total) by mouth daily.    Dispense:  30 tablet    Refill:  4  . Fluticasone Furoate (ARNUITY ELLIPTA) 100 MCG/ACT AEPB    Sig: Inhale 1 puff into the lungs daily.    Dispense:  1 each    Refill:  3    Diagnositics: Spirometry reveals an FVC of 4.96 L (94% predicted) and an FEV1 of 3.16 L (72% predicted) with significant (37%) post  bronchodilator improvement.    Physical examination: Blood pressure 120/90, pulse 65, temperature 98.5 F (36.9 C), temperature source Oral, resp. rate 17.  General: Alert, interactive, in no acute distress. HEENT: TMs pearly gray, turbinates moderately edematous without discharge, post-pharynx moderately erythematous. Neck: Supple without lymphadenopathy. Lungs: Clear to auscultation without wheezing, rhonchi or rales. CV: Normal S1, S2 without murmurs. Skin: Warm and dry, without lesions or rashes.  The following portions of the patient's history were reviewed and updated as appropriate: allergies, current medications, past family history, past medical history, past social history, past surgical history and problem list.    Medication List       This list is accurate as of: 09/23/15  1:57 PM.  Always use your most recent med list.               ALPRAZolam 0.25 MG tablet  Commonly known as:  XANAX  TAKE 1 TABLET ONCE DAILY AS NEEDED FOR ANXIETY     buPROPion 150 MG 24 hr tablet  Commonly known as:  WELLBUTRIN XL  TAKE 1 TABLET ONCE DAILY IN AM     citalopram 20 MG tablet  Commonly known as:  CELEXA  Take 1 tablet (20 mg total) by mouth daily.     cyclobenzaprine 10 MG tablet  Commonly known as:  FLEXERIL  Take 1 tablet (10 mg total) by mouth 2 (two) times daily as needed for muscle spasms.     flurbiprofen 100 MG tablet  Commonly known as:  ANSAID  TAKE  ONE TABLET BY MOUTH TWICE DAILY AS NEEDED FOR HEADACHE OR NECK PAIN. NO MORE THAN 4 DAYS/WEEK     Fluticasone Furoate 100 MCG/ACT Aepb  Commonly known as:  ARNUITY ELLIPTA  Inhale 1 puff into the lungs daily.     levocetirizine 5 MG tablet  Commonly known as:  XYZAL  Take 1 tablet (5 mg total) by mouth daily.     metaxalone 800 MG tablet  Commonly known as:  SKELAXIN  TAKE 1/2 TO 1 TABLET BY MOUTH THREE TIMES DAILY AS NEEDED FOR MUSCLE TIGHTNESS     montelukast 10 MG tablet  Commonly known as:  SINGULAIR    Reported on 09/23/2015     ondansetron 4 MG tablet  Commonly known as:  ZOFRAN  Take 1 tablet (4 mg total) by mouth every 8 (eight) hours as needed for nausea or vomiting.     sertraline 50 MG tablet  Commonly known as:  ZOLOFT  Reported on 09/23/2015     triazolam 0.25 MG tablet  Commonly known as:  HALCION  Take 1 tablet (0.25 mg total) by mouth at bedtime as needed.        No Known Allergies  I appreciate the opportunity to take part in this [facility]'s care. Please do not hesitate to contact me with questions.  Sincerely,   R. Edgar Frisk, MD

## 2015-11-18 ENCOUNTER — Encounter: Payer: Self-pay | Admitting: Allergy and Immunology

## 2015-11-18 ENCOUNTER — Ambulatory Visit (INDEPENDENT_AMBULATORY_CARE_PROVIDER_SITE_OTHER): Payer: 59 | Admitting: Allergy and Immunology

## 2015-11-18 VITALS — BP 120/90 | HR 75 | Temp 97.8°F | Resp 16

## 2015-11-18 DIAGNOSIS — J3089 Other allergic rhinitis: Secondary | ICD-10-CM | POA: Diagnosis not present

## 2015-11-18 DIAGNOSIS — J4530 Mild persistent asthma, uncomplicated: Secondary | ICD-10-CM

## 2015-11-18 MED ORDER — FLUTICASONE PROPIONATE 50 MCG/ACT NA SUSP: NASAL | Status: DC

## 2015-11-18 NOTE — Assessment & Plan Note (Signed)
   Continue appropriate allergen avoidance measures, montelukast 10 mg daily, and levocetirizine 5 mg daily as needed.  A prescription has been provided for fluticasone nasal spray, 2 sprays per nostril daily as needed. Proper nasal spray technique has been discussed and demonstrated.  If allergen avoidance measures and medications fail to adequately relieve symptoms, aeroallergen immunotherapy will be considered.

## 2015-11-18 NOTE — Assessment & Plan Note (Addendum)
Well-controlled.  Continue Arnuity 100/25 g, one inhalation daily, montelukast 10 mg daily at bedtime, and albuterol every 4-6 hours as needed.  Subjective and objective measures of pulmonary function will be followed and the treatment plan will be adjusted accordingly.

## 2015-11-18 NOTE — Patient Instructions (Addendum)
Mild persistent asthma Well-controlled.  Continue Arnuity 100/25 g, one inhalation daily, montelukast 10 mg daily at bedtime, and albuterol every 4-6 hours as needed.  Subjective and objective measures of pulmonary function will be followed and the treatment plan will be adjusted accordingly.  Allergic rhinitis  Continue appropriate allergen avoidance measures, montelukast 10 mg daily, and levocetirizine 5 mg daily as needed.  A prescription has been provided for fluticasone nasal spray, 2 sprays per nostril daily as needed. Proper nasal spray technique has been discussed and demonstrated.  If allergen avoidance measures and medications fail to adequately relieve symptoms, aeroallergen immunotherapy will be considered.    Return in about 5 months (around 04/19/2016), or if symptoms worsen or fail to improve.  Reducing Pollen Exposure  The American Academy of Allergy, Asthma and Immunology suggests the following steps to reduce your exposure to pollen during allergy seasons.    1. Do not hang sheets or clothing out to dry; pollen may collect on these items. 2. Do not mow lawns or spend time around freshly cut grass; mowing stirs up pollen. 3. Keep windows closed at night.  Keep car windows closed while driving. 4. Minimize morning activities outdoors, a time when pollen counts are usually at their highest. 5. Stay indoors as much as possible when pollen counts or humidity is high and on windy days when pollen tends to remain in the air longer. 6. Use air conditioning when possible.  Many air conditioners have filters that trap the pollen spores. 7. Use a HEPA room air filter to remove pollen form the indoor air you breathe.   Control of House Dust Mite Allergen  House dust mites play a major role in allergic asthma and rhinitis.  They occur in environments with high humidity wherever human skin, the food for dust mites is found. High levels have been detected in dust obtained from  mattresses, pillows, carpets, upholstered furniture, bed covers, clothes and soft toys.  The principal allergen of the house dust mite is found in its feces.  A gram of dust may contain 1,000 mites and 250,000 fecal particles.  Mite antigen is easily measured in the air during house cleaning activities.    1. Encase mattresses, including the box spring, and pillow, in an air tight cover.  Seal the zipper end of the encased mattresses with wide adhesive tape. 2. Wash the bedding in water of 130 degrees Farenheit weekly.  Avoid cotton comforters/quilts and flannel bedding: the most ideal bed covering is the dacron comforter. 3. Remove all upholstered furniture from the bedroom. 4. Remove carpets, carpet padding, rugs, and non-washable window drapes from the bedroom.  Wash drapes weekly or use plastic window coverings. 5. Remove all non-washable stuffed toys from the bedroom.  Wash stuffed toys weekly. 6. Have the room cleaned frequently with a vacuum cleaner and a damp dust-mop.  The patient should not be in a room which is being cleaned and should wait 1 hour after cleaning before going into the room. 7. Close and seal all heating outlets in the bedroom.  Otherwise, the room will become filled with dust-laden air.  An electric heater can be used to heat the room. Reduce indoor humidity to less than 50%.  Do not use a humidifier.  Control of Dog or Cat Allergen  Avoidance is the best way to manage a dog or cat allergy. If you have a dog or cat and are allergic to dog or cats, consider removing the dog or cat from the  home. If you have a dog or cat but don't want to find it a new home, or if your family wants a pet even though someone in the household is allergic, here are some strategies that may help keep symptoms at bay:  1. Keep the pet out of your bedroom and restrict it to only a few rooms. Be advised that keeping the dog or cat in only one room will not limit the allergens to that room. 2. Don't  pet, hug or kiss the dog or cat; if you do, wash your hands with soap and water. 3. High-efficiency particulate air (HEPA) cleaners run continuously in a bedroom or living room can reduce allergen levels over time. 4. Regular use of a high-efficiency vacuum cleaner or a central vacuum can reduce allergen levels. 5. Giving your dog or cat a bath at least once a week can reduce airborne allergen.  Control of Mold Allergen  Mold and fungi can grow on a variety of surfaces provided certain temperature and moisture conditions exist.  Outdoor molds grow on plants, decaying vegetation and soil.  The major outdoor mold, Alternaria and Cladosporium, are found in very high numbers during hot and dry conditions.  Generally, a late Summer - Fall peak is seen for common outdoor fungal spores.  Rain will temporarily lower outdoor mold spore count, but counts rise rapidly when the rainy period ends.  The most important indoor molds are Aspergillus and Penicillium.  Dark, humid and poorly ventilated basements are ideal sites for mold growth.  The next most common sites of mold growth are the bathroom and the kitchen.  Outdoor [employer] 1. Use air conditioning and keep windows closed 2. Avoid exposure to decaying vegetation. 3. Avoid leaf raking. 4. Avoid grain handling. 5. Consider wearing a face mask if working in moldy areas.  Indoor Mold Control 1. Maintain humidity below 50%. 2. Clean washable surfaces with 5% bleach solution. 3. Remove sources e.g. Contaminated carpets.  Control of Cockroach Allergen  Cockroach allergen has been identified as an important cause of acute attacks of asthma, especially in urban settings.  There are fifty-five species of cockroach that exist in the [place], however only three, the [place], [place] species produce allergen that can affect patients with Asthma.  Allergens can be obtained from fecal particles, egg casings and secretions from cockroaches.     1. Remove food sources. 2. Reduce access to water. 3. Seal access and entry points. 4. Spray runways with 0.5-1% Diazinon or Chlorpyrifos 5. Blow boric acid power under stoves and refrigerator. 6. Place bait stations (hydramethylnon) at feeding sites.

## 2015-11-18 NOTE — Progress Notes (Signed)
Follow-up Note  RE: Eric [name] MRN: [id number] DOB: 1986/01/28 Date of Office Visit: 11/18/2015  Primary care provider: Robyn Haber, MD Referring provider: Robyn Haber, MD  History of present illness: HPI Comments: Eric [name] is a 30 y.o. male with persistent asthma and allergic rhinitis who presents today for follow up.  He was last seen in this office on 09/23/2015.  He reports that with the emergence of tree pollen he has experienced increased nasal congestion, rhinorrhea, and sneezing.  He is currently taking levocetirizine 5 mg daily but is not using a medicated nasal spray.  He reports that his asthma is well-controlled using Arnuity Ellipta.  He reports that he initially experienced some minor side effects, flulike symptoms, from [place] but does perceived side effects have dissipated.  He has only required albuterol rescue once or twice over this past month and does not experience nocturnal awakenings due to lower respiratory symptoms.   Assessment and plan: Mild persistent asthma Well-controlled.  Continue Arnuity 100/25 g, one inhalation daily, montelukast 10 mg daily at bedtime, and albuterol every 4-6 hours as needed.  Subjective and objective measures of pulmonary function will be followed and the treatment plan will be adjusted accordingly.  Allergic rhinitis  Continue appropriate allergen avoidance measures, montelukast 10 mg daily, and levocetirizine 5 mg daily as needed.  A prescription has been provided for fluticasone nasal spray, 2 sprays per nostril daily as needed. Proper nasal spray technique has been discussed and demonstrated.  If allergen avoidance measures and medications fail to adequately relieve symptoms, aeroallergen immunotherapy will be considered.    Meds ordered this encounter  Medications  . fluticasone (FLONASE) 50 MCG/ACT nasal spray    Sig: Use 2 sprays per nostril once daily as needed    Dispense:  16 g    Refill:  5     Diagnositics: Spirometry:  Normal with an FEV1 of 93% predicted.  Please see scanned spirometry results for details.    Physical examination: Blood pressure 120/90, pulse 75, temperature 97.8 F (36.6 C), temperature source Oral, resp. rate 16.  General: Alert, interactive, in no acute distress. HEENT: TMs pearly gray, turbinates edematous with clear discharge, post-pharynx moderately erythematous. Neck: Supple without lymphadenopathy. Lungs: Clear to auscultation without wheezing, rhonchi or rales. CV: Normal S1, S2 without murmurs. Skin: Warm and dry, without lesions or rashes.  The following portions of the patient's history were reviewed and updated as appropriate: allergies, current medications, past family history, past medical history, past social history, past surgical history and problem list.    Medication List       This list is accurate as of: 11/18/15 12:15 PM.  Always use your most recent med list.               ALPRAZolam 0.25 MG tablet  Commonly known as:  XANAX  TAKE 1 TABLET ONCE DAILY AS NEEDED FOR ANXIETY     buPROPion 150 MG 24 hr tablet  Commonly known as:  WELLBUTRIN XL  TAKE 1 TABLET ONCE DAILY IN AM     citalopram 20 MG tablet  Commonly known as:  CELEXA  Take 1 tablet (20 mg total) by mouth daily.     cyclobenzaprine 10 MG tablet  Commonly known as:  FLEXERIL  Take 1 tablet (10 mg total) by mouth 2 (two) times daily as needed for muscle spasms.     flurbiprofen 100 MG tablet  Commonly known as:  ANSAID  TAKE ONE TABLET BY MOUTH TWICE  DAILY AS NEEDED FOR HEADACHE OR NECK PAIN. NO MORE THAN 4 DAYS/WEEK     fluticasone 50 MCG/ACT nasal spray  Commonly known as:  FLONASE  Use 2 sprays per nostril once daily as needed     Fluticasone Furoate 100 MCG/ACT Aepb  Commonly known as:  ARNUITY ELLIPTA  Inhale 1 puff into the lungs daily.     levocetirizine 5 MG tablet  Commonly known as:  XYZAL  Take 1 tablet (5 mg total) by mouth daily.      metaxalone 800 MG tablet  Commonly known as:  SKELAXIN  TAKE 1/2 TO 1 TABLET BY MOUTH THREE TIMES DAILY AS NEEDED FOR MUSCLE TIGHTNESS     montelukast 10 MG tablet  Commonly known as:  SINGULAIR  Reported on 11/18/2015     ondansetron 4 MG tablet  Commonly known as:  ZOFRAN  Take 1 tablet (4 mg total) by mouth every 8 (eight) hours as needed for nausea or vomiting.     sertraline 50 MG tablet  Commonly known as:  ZOLOFT  Reported on 09/23/2015     triazolam 0.25 MG tablet  Commonly known as:  HALCION  Take 1 tablet (0.25 mg total) by mouth at bedtime as needed.        No Known Allergies  I appreciate the opportunity to take part in this [facility]'s care. Please do not hesitate to contact me with questions.  Sincerely,   R. Edgar Frisk, MD

## 2016-01-18 ENCOUNTER — Emergency Department (HOSPITAL_COMMUNITY)
Admission: EM | Admit: 2016-01-18 | Discharge: 2016-01-18 | Disposition: A | Discharge status: Home / Self Care | Payer: 59 | Attending: Emergency Medicine | Admitting: Emergency Medicine

## 2016-01-18 ENCOUNTER — Encounter (HOSPITAL_COMMUNITY): Payer: Self-pay | Admitting: Emergency Medicine

## 2016-01-18 DIAGNOSIS — Y929 Unspecified place or not applicable: Secondary | ICD-10-CM | POA: Diagnosis not present

## 2016-01-18 DIAGNOSIS — X58XXXA Exposure to other specified factors, initial encounter: Secondary | ICD-10-CM | POA: Insufficient documentation

## 2016-01-18 DIAGNOSIS — S161XXA Strain of muscle, fascia and tendon at neck level, initial encounter: Secondary | ICD-10-CM | POA: Diagnosis not present

## 2016-01-18 DIAGNOSIS — Y939 Activity, unspecified: Secondary | ICD-10-CM | POA: Insufficient documentation

## 2016-01-18 DIAGNOSIS — M62838 Other muscle spasm: Secondary | ICD-10-CM | POA: Diagnosis present

## 2016-01-18 DIAGNOSIS — R51 Headache: Secondary | ICD-10-CM | POA: Diagnosis not present

## 2016-01-18 DIAGNOSIS — Z79899 Other long term (current) drug therapy: Secondary | ICD-10-CM | POA: Insufficient documentation

## 2016-01-18 DIAGNOSIS — Z87891 Personal history of nicotine dependence: Secondary | ICD-10-CM | POA: Insufficient documentation

## 2016-01-18 DIAGNOSIS — Y999 Unspecified external cause status: Secondary | ICD-10-CM | POA: Diagnosis not present

## 2016-01-18 MED ORDER — DIPHENHYDRAMINE HCL 50 MG/ML IJ SOLN
25.0000 mg | Freq: Once | INTRAMUSCULAR | Status: AC
  Administered 2016-01-18: 25 mg via INTRAVENOUS
  Filled 2016-01-18: qty 1

## 2016-01-18 MED ORDER — KETOROLAC TROMETHAMINE 30 MG/ML IJ SOLN
30.0000 mg | Freq: Once | INTRAMUSCULAR | Status: AC
  Administered 2016-01-18: 30 mg via INTRAVENOUS
  Filled 2016-01-18: qty 1

## 2016-01-18 MED ORDER — HYDROCODONE-ACETAMINOPHEN 5-325 MG PO TABS: 1.0000 | ORAL_TABLET | Freq: Four times a day (QID) | ORAL | Status: DC | PRN

## 2016-01-18 MED ORDER — HYDROMORPHONE HCL 1 MG/ML IJ SOLN
1.0000 mg | Freq: Once | INTRAMUSCULAR | Status: AC
  Administered 2016-01-18: 1 mg via INTRAVENOUS
  Filled 2016-01-18: qty 1

## 2016-01-18 MED ORDER — METOCLOPRAMIDE HCL 5 MG/ML IJ SOLN
10.0000 mg | Freq: Once | INTRAMUSCULAR | Status: AC
  Administered 2016-01-18: 10 mg via INTRAVENOUS
  Filled 2016-01-18: qty 2

## 2016-01-18 NOTE — ED Provider Notes (Signed)
CSN: [id number]     Arrival date & time 01/18/16  1003 History   First MD Initiated Contact with Patient 01/18/16 1028     Chief Complaint  Patient presents with  . Migraine  . Spasms    Neck     (Consider location/radiation/quality/duration/timing/severity/associated sxs/prior Treatment) Patient is a 30 y.o. male presenting with migraines. The history is provided by the patient (Patient states that he has pain in his left neck superiorly causing a headache. This is happened before).  Migraine This is a recurrent problem. The current episode started 12 to 24 hours ago. The problem occurs constantly. The problem has not changed since onset.Associated symptoms include abdominal pain. Pertinent negatives include no chest pain and no headaches. Nothing aggravates the symptoms. Nothing relieves the symptoms.    Past Medical History  Diagnosis Date  . Headache(784.0)   . Parotid mass   . Sebaceous cyst    Past Surgical History  Procedure Laterality Date  . Excision benign cyst    . Wisdom tooth extraction Right    Family History  Problem Relation Age of Onset  . Allergic rhinitis Neg Hx   . Asthma Neg Hx   . Angioedema Neg Hx   . Atopy Neg Hx   . Eczema Neg Hx   . Immunodeficiency Neg Hx   . Urticaria Neg Hx    Social History  Substance Use Topics  . Smoking status: Former Smoker    Quit date: 01/05/2005  . Smokeless tobacco: None  . Alcohol Use: 0.0 oz/week    0 Standard drinks or equivalent per week    Review of Systems  Constitutional: Negative for appetite change and fatigue.  HENT: Negative for congestion, ear discharge and sinus pressure.   Eyes: Negative for discharge.  Respiratory: Negative for cough.   Cardiovascular: Negative for chest pain.  Gastrointestinal: Positive for abdominal pain. Negative for diarrhea.  Genitourinary: Negative for frequency and hematuria.  Musculoskeletal: Negative for back pain.  Skin: Negative for rash.  Neurological: Negative  for seizures and headaches.  Psychiatric/Behavioral: Negative for hallucinations.      Allergies  Review of patient's allergies indicates no known allergies.  Home Medications   Prior to Admission medications   Medication Sig Start Date End Date Taking? Authorizing Provider  ALPRAZolam (XANAX) 0.25 MG tablet TAKE 1 TABLET ONCE DAILY AS NEEDED FOR ANXIETY 08/20/15  Yes Historical Provider, MD  buPROPion (WELLBUTRIN XL) 150 MG 24 hr tablet TAKE 1 TABLET ONCE DAILY IN AM 09/05/15  Yes Historical Provider, MD  cyclobenzaprine (FLEXERIL) 10 MG tablet Take 1 tablet (10 mg total) by mouth 2 (two) times daily as needed for muscle spasms. Patient taking differently: Take 10-20 mg by mouth 2 (two) times daily as needed for muscle spasms.  08/30/12  Yes Eleanore E Egan, PA-C  flurbiprofen (ANSAID) 100 MG tablet TAKE ONE TABLET BY MOUTH TWICE DAILY AS NEEDED FOR HEADACHE OR NECK PAIN. NO MORE THAN 4 DAYS/WEEK 07/28/15  Yes Historical Provider, MD  fluticasone (FLONASE) 50 MCG/ACT nasal spray Use 2 sprays per nostril once daily as needed Patient taking differently: Place 2 sprays into both nostrils daily as needed for allergies.  11/18/15  Yes Adelina Mings, MD  Fluticasone Furoate (ARNUITY ELLIPTA) 100 MCG/ACT AEPB Inhale 1 puff into the lungs daily. 09/23/15  Yes Adelina Mings, MD  levocetirizine (XYZAL) 5 MG tablet Take 1 tablet (5 mg total) by mouth daily. 09/23/15  Yes Adelina Mings, MD  metaxalone [facility] 800 MG  tablet TAKE 1/2 TO 1 TABLET BY MOUTH THREE TIMES DAILY AS NEEDED FOR MUSCLE TIGHTNESS 07/28/15  Yes Historical Provider, MD  montelukast (SINGULAIR) 10 MG tablet Take 10 mg by mouth at bedtime. Reported on 11/18/2015 08/13/15  Yes Historical Provider, MD  ondansetron (ZOFRAN) 4 MG tablet Take 1 tablet (4 mg total) by mouth every 8 (eight) hours as needed for nausea or vomiting. 02/03/15  Yes Domenic Moras, PA-C  sertraline (ZOLOFT) 50 MG tablet take 50 mg by mouth once daily 09/05/15   Yes Historical Provider, MD  triazolam (HALCION) 0.25 MG tablet Take 1 tablet (0.25 mg total) by mouth at bedtime as needed. Patient taking differently: Take 0.25 mg by mouth at bedtime as needed for sleep.  04/28/12 01/18/16 Yes Robyn Haber, MD  citalopram (CELEXA) 20 MG tablet Take 1 tablet (20 mg total) by mouth daily. 03/15/12 03/15/13  Robyn Haber, MD  HYDROcodone-acetaminophen (NORCO/VICODIN) 5-325 MG tablet Take 1 tablet by mouth every 6 (six) hours as needed for moderate pain. 01/18/16   Milton Ferguson, MD   BP 120/82 mmHg  Pulse 101  Temp(Src) 98 F (36.7 C) (Oral)  Resp 20  SpO2 95% Physical Exam  Constitutional: He is oriented to person, place, and time. He appears well-developed.  HENT:  Head: Normocephalic.  Tender left superior neck  Eyes: Conjunctivae and EOM are normal. No scleral icterus.  Neck: Neck supple. No thyromegaly present.  Cardiovascular: Normal rate and regular rhythm.  Exam reveals no gallop and no friction rub.   No murmur heard. Pulmonary/Chest: No stridor. He has no wheezes. He has no rales. He exhibits no tenderness.  Abdominal: He exhibits no distension. There is no tenderness. There is no rebound.  Musculoskeletal: Normal range of motion. He exhibits no edema.  Lymphadenopathy:    He has no cervical adenopathy.  Neurological: He is oriented to person, place, and time. He exhibits normal muscle tone. Coordination normal.  Skin: No rash noted. No erythema.  Psychiatric: He has a normal mood and affect. His behavior is normal.    ED Course  Procedures (including critical care time) Labs Review Labs Reviewed - No data to display  Imaging Review No results found. I have personally reviewed and evaluated these images and lab results as part of my medical decision-making.   EKG Interpretation None      MDM   Final diagnoses:  Neck muscle strain, initial encounter   Patient's headache and neck ache improved with pain medicine. He will  follow-up as needed    Milton Ferguson, MD 01/18/16 1432

## 2016-01-18 NOTE — Discharge Instructions (Signed)
Follow up with your md if problems °

## 2016-01-18 NOTE — ED Notes (Signed)
Pt c/o left neck muscle spasm that has brought on a migraine that began this morning approx 5 am.

## 2016-01-25 ENCOUNTER — Other Ambulatory Visit: Payer: Self-pay | Admitting: Allergy and Immunology

## 2016-02-03 ENCOUNTER — Other Ambulatory Visit: Payer: Self-pay | Admitting: Surgery

## 2016-02-20 ENCOUNTER — Other Ambulatory Visit: Payer: Self-pay | Admitting: Allergy and Immunology

## 2016-02-29 ENCOUNTER — Other Ambulatory Visit: Payer: Self-pay | Admitting: Neurology

## 2016-02-29 DIAGNOSIS — M542 Cervicalgia: Secondary | ICD-10-CM

## 2016-03-04 ENCOUNTER — Encounter (HOSPITAL_BASED_OUTPATIENT_CLINIC_OR_DEPARTMENT_OTHER): Payer: Self-pay | Admitting: *Deleted

## 2016-03-06 ENCOUNTER — Encounter (HOSPITAL_BASED_OUTPATIENT_CLINIC_OR_DEPARTMENT_OTHER): Payer: Self-pay | Admitting: *Deleted

## 2016-03-11 NOTE — H&P (Signed)
Eric [name]  Location: [facility] Patient #: [id number] DOB: 04-Jan-1986 Married / Language: English / Race: White Male   History of Present Illness (Eric Ledesma A. Ninfa Linden MD;  Patient words: New-metal under skin near stomach.  The patient is a 30 year old male who presents with a complaint of a foreign body. This gentleman is referred by Dr. Domenick Gong for evaluation of a foreign body in his abdominal wall. He reports this occurred with a piece of metal at work several months ago. He has minimal discomfort from this. He has had no drainage. He reports that the small wound that had been created is healed. He is otherwise without complaints.   Other Problems Malachi Bonds, CMA; Anxiety Disorder Depression Migraine Headache  Past Surgical History Malachi Bonds, CMA No pertinent past surgical history  Diagnostic Studies History Malachi Bonds, CMA;  Colonoscopy never  Allergies Malachi Bonds, CMA;  No Known Drug Allergies  Medication History (Chemira Jones, CMA ALPRAZolam (0.25MG  Tablet, Oral) Active. Arnuity Ellipta (100MCG/ACT Aero Pow Br Act, Inhalation) Active. BuPROPion HCl ER (XL) (150MG  Tablet ER 24HR, Oral) Active. Fluticasone Propionate (50MCG/ACT Suspension, Nasal) Active. Levocetirizine Dihydrochloride (5MG  Tablet, Oral) Active. Montelukast Sodium (10MG  Tablet, Oral) Active. Sertraline HCl (50MG  Tablet, Oral) Active. Medications Reconciled  Social History Malachi Bonds, CMA;   Alcohol use Occasional alcohol use. Caffeine use Carbonated beverages. No drug use Tobacco use Never smoker.  Family History Malachi Bonds, CMA;  Family history unknown First Degree Relatives    Review of Systems Malachi Bonds CMA;  General Not Present- Appetite Loss, Chills, Fatigue, Fever, Night Sweats, Weight Gain and Weight Loss. Skin Not Present- Change in Wart/Mole, Dryness, Hives, Jaundice, New Lesions, Non-Healing Wounds, Rash and  Ulcer. HEENT Present- Sinus Pain. Not Present- Earache, Hearing Loss, Hoarseness, Nose Bleed, Oral Ulcers, Ringing in the Ears, Seasonal Allergies, Sore Throat, Visual Disturbances, Wears glasses/contact lenses and Yellow Eyes. Respiratory Not Present- Bloody sputum, Chronic Cough, Difficulty Breathing, Snoring and Wheezing. Breast Not Present- Breast Mass, Breast Pain, Nipple Discharge and Skin Changes. Gastrointestinal Not Present- Abdominal Pain, Bloating, Bloody Stool, Change in Bowel Habits, Chronic diarrhea, Constipation, Difficulty Swallowing, Excessive gas, Gets full quickly at meals, Hemorrhoids, Indigestion, Nausea, Rectal Pain and Vomiting. Male Genitourinary Not Present- Blood in Urine, Change in Urinary Stream, Frequency, Impotence, Nocturia, Painful Urination, Urgency and Urine Leakage. Musculoskeletal Present- Muscle Pain. Not Present- Back Pain, Joint Pain, Joint Stiffness, Muscle Weakness and Swelling of Extremities. Neurological Present- Headaches. Not Present- Decreased Memory, Fainting, Numbness, Seizures, Tingling, Tremor, Trouble walking and Weakness. Psychiatric Present- Anxiety. Not Present- Bipolar, Change in Sleep Pattern, Depression, Fearful and Frequent crying. Endocrine Not Present- Cold Intolerance, Excessive Hunger, Hair Changes, Heat Intolerance, Hot flashes and New Diabetes. Hematology Not Present- Easy Bruising, Excessive bleeding, Gland problems, HIV and Persistent Infections.  Vitals Malachi Bonds CMA;  Weight: 205.4 lb Height: 71in Body Surface Area: 2.13 m Body Mass Index: 28.65 kg/m  Temp.: 98.79F(Oral)  Pulse: 85 (Regular)        Physical Exam (Tyrece Vanterpool A. Ninfa Linden MD;  01The physical exam findings are as follows: Note:He is well. Lungs clear bilaterally Cardiovascular is regular rate and rhythm In the left upper quadrant of the abdominal wall there is a small scar and a slightly palpable foreign body below this. There is no erythema  and the area is nontender    Assessment & Plan (Akon Reinoso A. Ninfa Linden MD;   FOREIGN BODY OF ABDOMINAL WALL [phone]) Impression: He is requesting excision of foreign body which I  feel is reasonable. I plan on doing this in the operating room so I can have a C-arm available for x-ray guidance should this be necessary. I discussed the risks of surgery which includes but is not limited to bleeding, infection, etc. He understands and wishes to proceed with surgery

## 2016-03-12 ENCOUNTER — Ambulatory Visit (HOSPITAL_BASED_OUTPATIENT_CLINIC_OR_DEPARTMENT_OTHER)
Admission: RE | Admit: 2016-03-12 | Discharge: 2016-03-12 | Disposition: A | Discharge status: Home / Self Care | Payer: Worker's Compensation | Source: Ambulatory Visit | Attending: Surgery | Admitting: Surgery

## 2016-03-12 ENCOUNTER — Encounter (HOSPITAL_BASED_OUTPATIENT_CLINIC_OR_DEPARTMENT_OTHER)
Admission: RE | Disposition: A | Discharge status: Home / Self Care | Payer: Self-pay | Source: Ambulatory Visit | Attending: Surgery

## 2016-03-12 ENCOUNTER — Ambulatory Visit (HOSPITAL_BASED_OUTPATIENT_CLINIC_OR_DEPARTMENT_OTHER): Payer: Worker's Compensation | Admitting: Anesthesiology

## 2016-03-12 ENCOUNTER — Encounter (HOSPITAL_BASED_OUTPATIENT_CLINIC_OR_DEPARTMENT_OTHER): Payer: Self-pay | Admitting: *Deleted

## 2016-03-12 DIAGNOSIS — X58XXXA Exposure to other specified factors, initial encounter: Secondary | ICD-10-CM | POA: Insufficient documentation

## 2016-03-12 DIAGNOSIS — F319 Bipolar disorder, unspecified: Secondary | ICD-10-CM | POA: Insufficient documentation

## 2016-03-12 DIAGNOSIS — Z87891 Personal history of nicotine dependence: Secondary | ICD-10-CM | POA: Insufficient documentation

## 2016-03-12 DIAGNOSIS — S30851A Superficial foreign body of abdominal wall, initial encounter: Secondary | ICD-10-CM | POA: Diagnosis not present

## 2016-03-12 DIAGNOSIS — M795 Residual foreign body in soft tissue: Secondary | ICD-10-CM | POA: Diagnosis present

## 2016-03-12 HISTORY — DX: Depression, unspecified: F32.A

## 2016-03-12 HISTORY — DX: Obsessive-compulsive disorder, unspecified: F42.9

## 2016-03-12 HISTORY — DX: Anxiety disorder, unspecified: F41.9

## 2016-03-12 HISTORY — PX: FOREIGN BODY REMOVAL ABDOMINAL: SHX5319

## 2016-03-12 HISTORY — DX: Major depressive disorder, single episode, unspecified: F32.9

## 2016-03-12 HISTORY — DX: Bipolar disorder, unspecified: F31.9

## 2016-03-12 SURGERY — REMOVAL, FOREIGN BODY, ABDOMEN
Anesthesia: Monitor Anesthesia Care | Site: Abdomen | Laterality: Left

## 2016-03-12 MED ORDER — SODIUM BICARBONATE 4 % IV SOLN
INTRAVENOUS | Status: AC
  Filled 2016-03-12: qty 5

## 2016-03-12 MED ORDER — OXYCODONE HCL 5 MG PO TABS: 5.0000 mg | ORAL_TABLET | ORAL | Status: DC | PRN

## 2016-03-12 MED ORDER — FENTANYL CITRATE (PF) 100 MCG/2ML IJ SOLN
INTRAMUSCULAR | Status: AC
  Filled 2016-03-12: qty 2

## 2016-03-12 MED ORDER — SODIUM CHLORIDE 0.9% FLUSH: 3.0000 mL | INTRAVENOUS | Status: DC | PRN

## 2016-03-12 MED ORDER — MIDAZOLAM HCL 2 MG/2ML IJ SOLN
1.0000 mg | INTRAMUSCULAR | Status: DC | PRN
  Administered 2016-03-12: 2 mg via INTRAVENOUS

## 2016-03-12 MED ORDER — SCOPOLAMINE 1 MG/3DAYS TD PT72: 1.0000 | MEDICATED_PATCH | Freq: Once | TRANSDERMAL | Status: DC | PRN

## 2016-03-12 MED ORDER — ACETAMINOPHEN 650 MG RE SUPP: 650.0000 mg | RECTAL | Status: DC | PRN

## 2016-03-12 MED ORDER — ACETAMINOPHEN 325 MG PO TABS: 650.0000 mg | ORAL_TABLET | ORAL | Status: DC | PRN

## 2016-03-12 MED ORDER — LIDOCAINE HCL (PF) 1 % IJ SOLN
INTRAMUSCULAR | Status: AC
  Filled 2016-03-12: qty 30

## 2016-03-12 MED ORDER — CHLORHEXIDINE GLUCONATE CLOTH 2 % EX PADS: 6.0000 | MEDICATED_PAD | Freq: Once | CUTANEOUS | Status: DC

## 2016-03-12 MED ORDER — PROPOFOL 10 MG/ML IV BOLUS
INTRAVENOUS | Status: AC
  Filled 2016-03-12: qty 20

## 2016-03-12 MED ORDER — ONDANSETRON HCL 4 MG/2ML IJ SOLN
INTRAMUSCULAR | Status: AC
  Filled 2016-03-12: qty 2

## 2016-03-12 MED ORDER — CEFAZOLIN SODIUM-DEXTROSE 2-4 GM/100ML-% IV SOLN
2.0000 g | INTRAVENOUS | Status: AC
  Administered 2016-03-12: 2 g via INTRAVENOUS

## 2016-03-12 MED ORDER — LIDOCAINE 2% (20 MG/ML) 5 ML SYRINGE
INTRAMUSCULAR | Status: AC
  Filled 2016-03-12: qty 5

## 2016-03-12 MED ORDER — BUPIVACAINE-EPINEPHRINE (PF) 0.5% -1:200000 IJ SOLN
INTRAMUSCULAR | Status: AC
  Filled 2016-03-12: qty 30

## 2016-03-12 MED ORDER — CEFAZOLIN SODIUM-DEXTROSE 2-4 GM/100ML-% IV SOLN
INTRAVENOUS | Status: AC
  Filled 2016-03-12: qty 100

## 2016-03-12 MED ORDER — KETOROLAC TROMETHAMINE 30 MG/ML IJ SOLN
INTRAMUSCULAR | Status: DC | PRN
  Administered 2016-03-12: 30 mg via INTRAVENOUS

## 2016-03-12 MED ORDER — KETOROLAC TROMETHAMINE 30 MG/ML IJ SOLN
INTRAMUSCULAR | Status: AC
  Filled 2016-03-12: qty 1

## 2016-03-12 MED ORDER — ONDANSETRON HCL 4 MG/2ML IJ SOLN
INTRAMUSCULAR | Status: DC | PRN
Start: 2016-03-12 — End: 2016-03-12
  Administered 2016-03-12: 4 mg via INTRAVENOUS

## 2016-03-12 MED ORDER — FENTANYL CITRATE (PF) 100 MCG/2ML IJ SOLN: 25.0000 ug | INTRAMUSCULAR | Status: DC | PRN

## 2016-03-12 MED ORDER — PROPOFOL 10 MG/ML IV BOLUS
INTRAVENOUS | Status: DC | PRN
  Administered 2016-03-12: 50 mg via INTRAVENOUS

## 2016-03-12 MED ORDER — SODIUM CHLORIDE 0.9 % IV SOLN: 250.0000 mL | INTRAVENOUS | Status: DC | PRN

## 2016-03-12 MED ORDER — MIDAZOLAM HCL 2 MG/2ML IJ SOLN
INTRAMUSCULAR | Status: AC
  Filled 2016-03-12: qty 2

## 2016-03-12 MED ORDER — SODIUM CHLORIDE 0.9% FLUSH: 3.0000 mL | Freq: Two times a day (BID) | INTRAVENOUS | Status: DC

## 2016-03-12 MED ORDER — FENTANYL CITRATE (PF) 100 MCG/2ML IJ SOLN
50.0000 ug | INTRAMUSCULAR | Status: DC | PRN
  Administered 2016-03-12: 100 ug via INTRAVENOUS

## 2016-03-12 MED ORDER — OXYCODONE HCL 5 MG/5ML PO SOLN: 5.0000 mg | Freq: Once | ORAL | Status: DC | PRN

## 2016-03-12 MED ORDER — LIDOCAINE HCL (PF) 1 % IJ SOLN
INTRAMUSCULAR | Status: DC | PRN
  Administered 2016-03-12: 4.5 mL

## 2016-03-12 MED ORDER — OXYCODONE HCL 5 MG PO TABS: 5.0000 mg | ORAL_TABLET | Freq: Once | ORAL | Status: DC | PRN

## 2016-03-12 MED ORDER — MORPHINE SULFATE (PF) 2 MG/ML IV SOLN: 1.0000 mg | INTRAVENOUS | Status: DC | PRN

## 2016-03-12 MED ORDER — ONDANSETRON HCL 4 MG/2ML IJ SOLN: 4.0000 mg | Freq: Four times a day (QID) | INTRAMUSCULAR | Status: DC | PRN

## 2016-03-12 MED ORDER — GLYCOPYRROLATE 0.2 MG/ML IJ SOLN: 0.2000 mg | Freq: Once | INTRAMUSCULAR | Status: DC | PRN

## 2016-03-12 MED ORDER — LACTATED RINGERS IV SOLN
INTRAVENOUS | Status: DC
  Administered 2016-03-12: 13:00:00 via INTRAVENOUS

## 2016-03-12 MED ORDER — HYDROCODONE-ACETAMINOPHEN 5-325 MG PO TABS: 1.0000 | ORAL_TABLET | ORAL | 0 refills | Status: DC | PRN

## 2016-03-12 SURGICAL SUPPLY — 43 items
BLADE CLIPPER SURG (BLADE) IMPLANT
BLADE HEX COATED 2.75 (ELECTRODE) ×4 IMPLANT
BLADE SURG 15 STRL LF DISP TIS (BLADE) ×2 IMPLANT
BLADE SURG 15 STRL SS (BLADE) ×4
CANISTER SUCT 1200ML W/VALVE (MISCELLANEOUS) IMPLANT
CHLORAPREP W/TINT 26ML (MISCELLANEOUS) ×4 IMPLANT
COVER BACK TABLE 60X90IN (DRAPES) ×4 IMPLANT
COVER MAYO STAND STRL (DRAPES) ×4 IMPLANT
DECANTER SPIKE VIAL GLASS SM (MISCELLANEOUS) IMPLANT
DRAPE LAPAROTOMY 100X72 PEDS (DRAPES) ×4 IMPLANT
DRAPE UTILITY XL STRL (DRAPES) ×4 IMPLANT
ELECT REM PT RETURN 9FT ADLT (ELECTROSURGICAL) ×4
ELECTRODE REM PT RTRN 9FT ADLT (ELECTROSURGICAL) ×2 IMPLANT
GLOVE BIO SURGEON STRL SZ 6.5 (GLOVE) ×2 IMPLANT
GLOVE BIO SURGEONS STRL SZ 6.5 (GLOVE) ×1
GLOVE BIOGEL PI IND STRL 7.0 (GLOVE) ×1 IMPLANT
GLOVE BIOGEL PI INDICATOR 7.0 (GLOVE) ×2
GLOVE EXAM NITRILE EXT CUFF MD (GLOVE) ×3 IMPLANT
GLOVE SURG SIGNA 7.5 PF LTX (GLOVE) ×4 IMPLANT
GOWN STRL REUS W/ TWL LRG LVL3 (GOWN DISPOSABLE) ×2 IMPLANT
GOWN STRL REUS W/ TWL XL LVL3 (GOWN DISPOSABLE) ×2 IMPLANT
GOWN STRL REUS W/TWL LRG LVL3 (GOWN DISPOSABLE) ×4
GOWN STRL REUS W/TWL XL LVL3 (GOWN DISPOSABLE) ×4
LIQUID BAND (GAUZE/BANDAGES/DRESSINGS) ×5 IMPLANT
NDL HYPO 25X1 1.5 SAFETY (NEEDLE) ×1 IMPLANT
NEEDLE HYPO 25X1 1.5 SAFETY (NEEDLE) ×4 IMPLANT
NS IRRIG 1000ML POUR BTL (IV SOLUTION) IMPLANT
PACK BASIN DAY SURGERY FS (CUSTOM PROCEDURE TRAY) ×4 IMPLANT
PENCIL BUTTON HOLSTER BLD 10FT (ELECTRODE) ×4 IMPLANT
SLEEVE SCD COMPRESS KNEE MED (MISCELLANEOUS) IMPLANT
SPONGE LAP 4X18 X RAY DECT (DISPOSABLE) ×4 IMPLANT
SUT MNCRL AB 4-0 PS2 18 (SUTURE) ×3 IMPLANT
SUT VIC AB 2-0 SH 27 (SUTURE)
SUT VIC AB 2-0 SH 27XBRD (SUTURE) IMPLANT
SUT VIC AB 3-0 SH 27 (SUTURE)
SUT VIC AB 3-0 SH 27X BRD (SUTURE) IMPLANT
SYR BULB 3OZ (MISCELLANEOUS) IMPLANT
SYR CONTROL 10ML LL (SYRINGE) ×4 IMPLANT
TOWEL OR 17X24 6PK STRL BLUE (TOWEL DISPOSABLE) ×4 IMPLANT
TOWEL OR NON WOVEN STRL DISP B (DISPOSABLE) ×1 IMPLANT
TUBE CONNECTING 20'X1/4 (TUBING)
TUBE CONNECTING 20X1/4 (TUBING) IMPLANT
YANKAUER SUCT BULB TIP NO VENT (SUCTIONS) IMPLANT

## 2016-03-12 NOTE — Anesthesia Preprocedure Evaluation (Addendum)
Anesthesia Evaluation  Patient identified by MRN, date of birth, ID band Patient awake    Reviewed: Allergy & Precautions, H&P , NPO status , Patient's Chart, lab work & pertinent test results  Airway Mallampati: II   Neck ROM: full    Dental   Pulmonary asthma , former smoker,    breath sounds clear to auscultation       Cardiovascular negative cardio ROS   Rhythm:regular Rate:Normal     Neuro/Psych  Headaches, PSYCHIATRIC DISORDERS Anxiety Depression Bipolar Disorder    GI/Hepatic   Endo/Other    Renal/GU      Musculoskeletal   Abdominal   Peds  Hematology   Anesthesia Other Findings   Reproductive/Obstetrics                             Anesthesia Physical Anesthesia Plan  ASA: II  Anesthesia Plan: MAC   Post-op Pain Management:    Induction: Intravenous  Airway Management Planned: Simple Face Mask  Additional Equipment:   Intra-op Plan:   Post-operative Plan:   Informed Consent: I have reviewed the patients History and Physical, chart, labs and discussed the procedure including the risks, benefits and alternatives for the proposed anesthesia with the patient or authorized representative who has indicated his/her understanding and acceptance.     Plan Discussed with: CRNA, Anesthesiologist and Surgeon  Anesthesia Plan Comments:        Anesthesia Quick Evaluation

## 2016-03-12 NOTE — Anesthesia Postprocedure Evaluation (Signed)
Anesthesia Post Note  Patient: Eric [name]  Procedure(s) Performed: Procedure(s) (LRB): EXCISION  FOREIGN BODY FROM  ABDOMINAL WALL (Left)  Patient location during evaluation: PACU Anesthesia Type: MAC Level of consciousness: awake and alert Pain management: pain level controlled Vital Signs Assessment: post-procedure vital signs reviewed and stable Respiratory status: spontaneous breathing, nonlabored ventilation, respiratory function stable and patient connected to nasal cannula oxygen Cardiovascular status: stable and blood pressure returned to baseline Anesthetic complications: no    Last Vitals:  Vitals:   03/12/16 1415 03/12/16 1438  BP: 117/83 118/78  Pulse: 63 68  Resp: 13 18  Temp:  36.8 C    Last Pain:  Vitals:   03/12/16 1232  TempSrc: Oral                 Sharra Cayabyab S

## 2016-03-12 NOTE — Transfer of Care (Signed)
Immediate Anesthesia Transfer of Care Note  Patient: Eric [name]  Procedure(s) Performed: Procedure(s): EXCISION  FOREIGN BODY FROM  ABDOMINAL WALL (N/A)  Patient Location: PACU  Anesthesia Type:MAC  Level of Consciousness: awake, alert  and oriented  Airway & Oxygen Therapy: Patient Spontanous Breathing and Patient connected to face mask oxygen  Post-op Assessment: Report given to RN and Post -op Vital signs reviewed and stable  Post vital signs: Reviewed and stable  Last Vitals:  Vitals:   03/12/16 1232 03/12/16 1340  BP: (!) 145/97 123/79  Pulse: 76   Resp: 18   Temp: 36.9 C     Last Pain:  Vitals:   03/12/16 1232  TempSrc: Oral      Patients Stated Pain Goal: 0 ([id number])  Complications: No apparent anesthesia complications

## 2016-03-12 NOTE — Discharge Instructions (Signed)
Ok to shower starting tomorrow  Ibuprofen and ice pack also for pain  Call your surgeon if you experience:   1.  Fever over 101.0. 2.  Inability to urinate. 3.  Nausea and/or vomiting. 4.  Extreme swelling or bruising at the surgical site. 5.  Continued bleeding from the incision. 6.  Increased pain, redness or drainage from the incision. 7.  Problems related to your pain medication. 8.  Any problems and/or concerns   Post Anesthesia Home Care Instructions  Activity: Get plenty of rest for the remainder of the day. A responsible adult should stay with you for 24 hours following the procedure.  For the next 24 hours, DO NOT: -Drive a car -Paediatric nurse -Drink alcoholic beverages -Take any medication unless instructed by your physician -Make any legal decisions or sign important papers.  Meals: Start with liquid foods such as gelatin or soup. Progress to regular foods as tolerated. Avoid greasy, spicy, heavy foods. If nausea and/or vomiting occur, drink only clear liquids until the nausea and/or vomiting subsides. Call your physician if vomiting continues.  Special Instructions/Symptoms: Your throat may feel dry or sore from the anesthesia or the breathing tube placed in your throat during surgery. If this causes discomfort, gargle with warm salt water. The discomfort should disappear within 24 hours.  If you had a scopolamine patch placed behind your ear for the management of post- operative nausea and/or vomiting:  1. The medication in the patch is effective for 72 hours, after which it should be removed.  Wrap patch in a tissue and discard in the trash. Wash hands thoroughly with soap and water. 2. You may remove the patch earlier than 72 hours if you experience unpleasant side effects which may include dry mouth, dizziness or visual disturbances. 3. Avoid touching the patch. Wash your hands with soap and water after contact with the patch.

## 2016-03-12 NOTE — Interval H&P Note (Signed)
History and Physical Interval Note: no change in H and P  03/12/2016 1:05 PM  Eric [name]  has presented today for surgery, with the diagnosis of foreign body of abdominal wall  The various methods of treatment have been discussed with the patient and family. After consideration of risks, benefits and other options for treatment, the patient has consented to  Procedure(s): [place] (N/A) as a surgical intervention .  The patient's history has been reviewed, patient examined, no change in status, stable for surgery.  I have reviewed the patient's chart and labs.  Questions were answered to the patient's satisfaction.     Carolanne Mercier A

## 2016-03-12 NOTE — Op Note (Signed)
EXCISION  FOREIGN BODY FROM  ABDOMINAL WALL  Procedure Note  Eric [name] 03/12/2016   Pre-op Diagnosis: foreign body of abdominal wall     Post-op Diagnosis: same  Procedure(s): EXCISION  FOREIGN BODY FROM  ABDOMINAL WALL  Surgeon(s): Coralie Keens, MD  Anesthesia: Monitor Anesthesia Care  Staff:  Circulator: Ted Mcalpine, RN Scrub Person: Vara Guardian, RN  Estimated Blood Loss: Minimal               Specimens: sent to path          [name]   Date: 03/12/2016  Time: 1:35 PM

## 2016-03-13 ENCOUNTER — Other Ambulatory Visit: Payer: Self-pay

## 2016-03-13 NOTE — Op Note (Signed)
[name], [name] NO.:  [id number]  MEDICAL RECORD NO.:  [id number]  LOCATION:                                 FACILITY:  PHYSICIAN:  Coralie Keens, M.D. DATE OF BIRTH:  01-Jan-1986  DATE OF PROCEDURE:  03/12/2016 DATE OF DISCHARGE:                              OPERATIVE REPORT   PREOPERATIVE DIAGNOSIS:  Foreign body at the abdominal wall.  POSTOPERATIVE DIAGNOSIS:  Foreign body at the abdominal wall.  PROCEDURE:  Excision of foreign body at the abdominal wall.  SURGEON:  Coralie Keens, M.D.  ANESTHESIA:  1% lidocaine and monitored anesthesia care.  ESTIMATED BLOOD LOSS:  Minimal.  INDICATIONS:  A 30 year old gentleman, who presents with a foreign body underneath the skin of his left upper quadrant of the abdominal wall. It has been present for several months.  He had sustained this at work. A decision was made to proceed with excision of the small foreign body.  PROCEDURE IN DETAIL:  The patient was brought to the operating room, identified as the correct patient.  He was placed supine on the operating table and anesthesia was induced.  His left upper quadrant was prepped and draped in usual sterile fashion.  I made an elliptical incision around the previous scar in the slightly visible foreign body. I took this down to subcutaneous tissue with electrocautery and completely excised the pieces of skin containing the foreign body.  I could easily palpate and see the edge of the foreign body in the tissue. I then palpated the wound and found no other foreign body.  I then anesthetized the wound further with lidocaine.  I closed the skin and subcutaneous tissue with interrupted 4-0 Monocryl sutures and skin glue. The patient tolerated the procedure well.  All the counts were correct at the end of the procedure.  The patient was then taken in a stable condition to the recovery room.     Coralie Keens,  M.D.   ______________________________ Coralie Keens, M.D.   DB/MEDQ  D:  03/12/2016  T:  03/12/2016  Job:  [id number]

## 2016-03-14 ENCOUNTER — Ambulatory Visit
Admission: RE | Admit: 2016-03-14 | Discharge: 2016-03-14 | Disposition: A | Discharge status: Home / Self Care | Payer: PRIVATE HEALTH INSURANCE | Source: Ambulatory Visit | Attending: Neurology | Admitting: Neurology

## 2016-03-14 DIAGNOSIS — M542 Cervicalgia: Secondary | ICD-10-CM

## 2016-03-16 ENCOUNTER — Encounter (HOSPITAL_BASED_OUTPATIENT_CLINIC_OR_DEPARTMENT_OTHER): Payer: Self-pay | Admitting: Surgery

## 2016-04-21 ENCOUNTER — Ambulatory Visit: Payer: 59 | Admitting: Allergy and Immunology

## 2016-07-03 ENCOUNTER — Other Ambulatory Visit: Payer: Self-pay | Admitting: Allergy and Immunology

## 2016-08-21 ENCOUNTER — Other Ambulatory Visit: Payer: Self-pay

## 2016-08-21 DIAGNOSIS — D485 Neoplasm of uncertain behavior of skin: Secondary | ICD-10-CM | POA: Diagnosis not present

## 2016-08-28 ENCOUNTER — Other Ambulatory Visit: Payer: Self-pay | Admitting: Allergy and Immunology

## 2016-09-07 DIAGNOSIS — G43009 Migraine without aura, not intractable, without status migrainosus: Secondary | ICD-10-CM | POA: Diagnosis not present

## 2016-09-07 DIAGNOSIS — M542 Cervicalgia: Secondary | ICD-10-CM | POA: Diagnosis not present

## 2016-09-07 DIAGNOSIS — G44219 Episodic tension-type headache, not intractable: Secondary | ICD-10-CM | POA: Diagnosis not present

## 2016-10-09 DIAGNOSIS — J309 Allergic rhinitis, unspecified: Secondary | ICD-10-CM | POA: Diagnosis not present

## 2016-12-28 DIAGNOSIS — G44211 Episodic tension-type headache, intractable: Secondary | ICD-10-CM | POA: Diagnosis not present

## 2016-12-28 DIAGNOSIS — G43019 Migraine without aura, intractable, without status migrainosus: Secondary | ICD-10-CM | POA: Diagnosis not present

## 2017-04-08 ENCOUNTER — Emergency Department (HOSPITAL_COMMUNITY)
Admission: EM | Admit: 2017-04-08 | Discharge: 2017-04-08 | Disposition: A | Discharge status: Home / Self Care | Payer: 59 | Attending: Emergency Medicine | Admitting: Emergency Medicine

## 2017-04-08 DIAGNOSIS — G43909 Migraine, unspecified, not intractable, without status migrainosus: Secondary | ICD-10-CM | POA: Diagnosis not present

## 2017-04-08 DIAGNOSIS — G43009 Migraine without aura, not intractable, without status migrainosus: Secondary | ICD-10-CM | POA: Insufficient documentation

## 2017-04-08 DIAGNOSIS — Z87891 Personal history of nicotine dependence: Secondary | ICD-10-CM | POA: Insufficient documentation

## 2017-04-08 DIAGNOSIS — Z79899 Other long term (current) drug therapy: Secondary | ICD-10-CM | POA: Insufficient documentation

## 2017-04-08 MED ORDER — METOCLOPRAMIDE HCL 5 MG/ML IJ SOLN: 10.0000 mg | Freq: Once | INTRAMUSCULAR | Status: DC

## 2017-04-08 MED ORDER — DEXAMETHASONE SODIUM PHOSPHATE 10 MG/ML IJ SOLN
10.0000 mg | Freq: Once | INTRAMUSCULAR | Status: AC
  Administered 2017-04-08: 10 mg via INTRAVENOUS

## 2017-04-08 MED ORDER — DEXAMETHASONE SODIUM PHOSPHATE 10 MG/ML IJ SOLN
10.0000 mg | Freq: Once | INTRAMUSCULAR | Status: DC
  Filled 2017-04-08: qty 1

## 2017-04-08 MED ORDER — DIPHENHYDRAMINE HCL 50 MG/ML IJ SOLN
25.0000 mg | Freq: Once | INTRAMUSCULAR | Status: AC
  Administered 2017-04-08: 25 mg via INTRAVENOUS
  Filled 2017-04-08: qty 1

## 2017-04-08 MED ORDER — PROCHLORPERAZINE EDISYLATE 5 MG/ML IJ SOLN
10.0000 mg | Freq: Once | INTRAMUSCULAR | Status: AC
  Administered 2017-04-08: 10 mg via INTRAVENOUS
  Filled 2017-04-08: qty 2

## 2017-04-08 MED ORDER — KETOROLAC TROMETHAMINE 15 MG/ML IJ SOLN
15.0000 mg | Freq: Once | INTRAMUSCULAR | Status: AC
  Administered 2017-04-08: 15 mg via INTRAVENOUS
  Filled 2017-04-08: qty 1

## 2017-04-08 MED ORDER — BUTALBITAL-APAP-CAFFEINE 50-325-40 MG PO TABS: 1.0000 | ORAL_TABLET | Freq: Four times a day (QID) | ORAL | 0 refills | Status: AC | PRN

## 2017-04-08 MED ORDER — SODIUM CHLORIDE 0.9 % IV BOLUS (SEPSIS)
1000.0000 mL | Freq: Once | INTRAVENOUS | Status: AC
  Administered 2017-04-08: 1000 mL via INTRAVENOUS

## 2017-04-08 NOTE — ED Notes (Signed)
Bed: WLPT1 Expected date:  Expected time:  Means of arrival:  Comments: 

## 2017-04-08 NOTE — ED Triage Notes (Signed)
Pt states that he gets migraines often but about once a year he has to come to the ED when it gets too severe. Alert and oriented. Neuro intact.

## 2017-04-08 NOTE — ED Provider Notes (Addendum)
[facility] DEPT Provider Note   CSN: [id number] Arrival date & time: 04/08/17  9702     History   Chief Complaint Chief Complaint  Patient presents with  . Migraine    HPI Eric [name] is a 31 y.o. male.  HPI   31 year old male with history of chronic cervical occipital neuralgia, migraines, bipolar disorder, OCD, who presents with headache. The patient states that he has been working more than usual over the last several days. He has been under increased stress. This morning, at around 4 AM, he awoke with throbbing, aching, but also stabbing left upper neck and occipital headache. This is similar to the location, onset, and quality of his usual headaches. It is 10 out of 10 in severity. He takes Flexeril and ibuprofen for this normally but this has not improved his symptoms. He denies any associated symptoms. Denies any recent trauma to the area. No recent heavy lifting or twisting. Denies any vision changes, numbness, weakness, area he states that he has had multiple negative MRIs of his neck and head for this and that the headache is similar to his usual one. Denies any neck stiffness or photophobia. Denies any fevers. No recent illnesses.  Past Medical History:  Diagnosis Date  . Anxiety   . Bipolar disorder ([place])   . Depression   . Headache(784.0)    migraines  . OCD (obsessive compulsive disorder)   . Parotid mass   . Sebaceous cyst     Patient Active Problem List   Diagnosis Date Noted  . Mild persistent asthma 09/23/2015  . Allergic rhinitis 09/23/2015  . Sebaceous cyst 07/10/2012    Past Surgical History:  Procedure Laterality Date  . Excision Benign Cyst    . FOREIGN BODY REMOVAL ABDOMINAL Left 03/12/2016   Procedure: EXCISION  FOREIGN BODY FROM  ABDOMINAL WALL;  Surgeon: Coralie Keens, MD;  Location: [facility];  Service: General;  Laterality: Left;  . WISDOM TOOTH EXTRACTION Right        Home Medications    Prior to Admission  medications   Medication Sig Start Date End Date Taking? Authorizing Provider  ALPRAZolam (XANAX) 0.25 MG tablet TAKE 1 TABLET ONCE DAILY AS NEEDED FOR ANXIETY 08/20/15   [provider]  ARNUITY ELLIPTA 100 MCG/ACT AEPB INHALE 1 PUFF INTO THE LUNGS DAILY. 07/03/16   Bobbitt, Sedalia Muta, MD  buPROPion (WELLBUTRIN XL) 150 MG 24 hr tablet TAKE 1 TABLET ONCE DAILY IN AM 09/05/15   [provider]  butalbital-acetaminophen-caffeine (FIORICET, ESGIC) 50-325-40 MG tablet Take 1-2 tablets by mouth every 6 (six) hours as needed for headache. 04/08/17 04/08/18  Duffy Bruce, MD  citalopram (CELEXA) 20 MG tablet Take 1 tablet (20 mg total) by mouth daily. 03/15/12 03/12/16  Robyn Haber, MD  cyclobenzaprine (FLEXERIL) 10 MG tablet Take 1 tablet (10 mg total) by mouth 2 (two) times daily as needed for muscle spasms. Patient taking differently: Take 10-20 mg by mouth 2 (two) times daily as needed for muscle spasms.  08/30/12   Bailey Mech E, PA-C  flurbiprofen (ANSAID) 100 MG tablet TAKE ONE TABLET BY MOUTH TWICE DAILY AS NEEDED FOR HEADACHE OR NECK PAIN. NO MORE THAN 4 DAYS/WEEK 07/28/15   [provider]  fluticasone (FLONASE) 50 MCG/ACT nasal spray Use 2 sprays per nostril once daily as needed Patient taking differently: Place 2 sprays into both nostrils daily as needed for allergies.  11/18/15   Bobbitt, Sedalia Muta, MD  HYDROcodone-acetaminophen (NORCO) 5-325 MG tablet  Take 1-2 tablets by mouth every 4 (four) hours as needed for moderate pain. 03/12/16   Coralie Keens, MD  levocetirizine (XYZAL) 5 MG tablet TAKE 1 TABLET (5 MG TOTAL) BY MOUTH DAILY. 02/20/16   Bobbitt, Sedalia Muta, MD  metaxalone (SKELAXIN) 800 MG tablet TAKE 1/2 TO 1 TABLET BY MOUTH THREE TIMES DAILY AS NEEDED FOR MUSCLE TIGHTNESS 07/28/15   [provider]  montelukast (SINGULAIR) 10 MG tablet Take 10 mg by mouth at bedtime. Reported on 11/18/2015 08/13/15   [provider]  ondansetron  (ZOFRAN) 4 MG tablet Take 1 tablet (4 mg total) by mouth every 8 (eight) hours as needed for nausea or vomiting. 02/03/15   Domenic Moras, PA-C  sertraline (ZOLOFT) 50 MG tablet take 50 mg by mouth once daily 09/05/15   [provider]  SUMAtriptan (IMITREX) 100 MG tablet  04/07/17   [provider]  triazolam (HALCION) 0.25 MG tablet Take 1 tablet (0.25 mg total) by mouth at bedtime as needed. Patient taking differently: Take 0.25 mg by mouth at bedtime as needed for sleep.  04/28/12 01/18/16  Robyn Haber, MD    Family History No family history on file.  Social History Social History  Substance Use Topics  . Smoking status: Former Smoker    Types: Cigarettes    Quit date: 01/05/2005  . Smokeless tobacco: Never Used  . Alcohol use 0.0 oz/week     Comment: occassionally     Allergies   Patient has no known allergies.   Review of Systems Review of Systems  Constitutional: Negative for chills, fatigue and fever.  HENT: Negative for congestion and rhinorrhea.   Eyes: Negative for visual disturbance.  Respiratory: Negative for cough, shortness of breath and wheezing.   Cardiovascular: Negative for chest pain and leg swelling.  Gastrointestinal: Negative for abdominal pain, diarrhea, nausea and vomiting.  Genitourinary: Negative for dysuria and flank pain.  Musculoskeletal: Positive for neck pain. Negative for neck stiffness.  Skin: Negative for rash and wound.  Allergic/Immunologic: Negative for immunocompromised state.  Neurological: Positive for headaches. Negative for syncope.  All other systems reviewed and are negative.    Physical Exam Updated Vital Signs BP (!) 142/100 (BP Location: Left Arm)   Pulse (!) 110   Temp 97.9 F (36.6 C) (Oral)   Resp (!) 24   Ht 5\' 10"  (1.778 m)   Wt 78 kg (172 lb)   SpO2 98%   BMI 24.68 kg/m   Physical Exam  Constitutional: He is oriented to person, place, and time. He appears well-developed and well-nourished. No  distress.  HENT:  Head: Normocephalic and atraumatic.  Eyes: Conjunctivae are normal.  Neck: Neck supple.  Mild TTP over left upper paraspinal neck pain. No carotid bruits.  Cardiovascular: Normal rate, regular rhythm and normal heart sounds.  Exam reveals no friction rub.   No murmur heard. Pulmonary/Chest: Effort normal and breath sounds normal. No respiratory distress. He has no wheezes. He has no rales.  Abdominal: He exhibits no distension.  Musculoskeletal: He exhibits no edema.  Neurological: He is alert and oriented to person, place, and time. He exhibits normal muscle tone.  Skin: Skin is warm. Capillary refill takes less than 2 seconds.  Psychiatric: He has a normal mood and affect.  Nursing note and vitals reviewed.   Neurological Exam:  Mental Status: Alert and oriented to person, place, and time. Attention and concentration normal. Speech clear. Recent memory is intact. Cranial Nerves: Visual fields grossly intact. EOMI and  PERRLA. No nystagmus noted. Facial sensation intact at forehead, maxillary cheek, and chin/mandible bilaterally. No facial asymmetry or weakness. Hearing grossly normal. Uvula is midline, and palate elevates symmetrically. Normal SCM and trapezius strength. Tongue midline without fasciculations. Motor: Muscle strength 5/5 in proximal and distal UE and LE bilaterally. No pronator drift. Muscle tone normal. Reflexes: 2+ and symmetrical in all four extremities.  Sensation: Intact to light touch in upper and lower extremities distally bilaterally.  Gait: Normal without ataxia. Coordination: Normal FTN bilaterally.   ED Treatments / Results  Labs (all labs ordered are listed, but only abnormal results are displayed) Labs Reviewed - No data to display  EKG  EKG Interpretation None       Radiology No results found.  Procedures Procedures (including critical care time)  Medications Ordered in ED Medications  sodium chloride 0.9 % bolus 1,000 mL  (1,000 mLs Intravenous New Bag/Given 04/08/17 0805)  ketorolac (TORADOL) 15 MG/ML injection 15 mg (15 mg Intravenous Given 04/08/17 0801)  diphenhydrAMINE (BENADRYL) injection 25 mg (25 mg Intravenous Given 04/08/17 0801)  prochlorperazine (COMPAZINE) injection 10 mg (10 mg Intravenous Given 04/08/17 0801)  dexamethasone (DECADRON) injection 10 mg (10 mg Intravenous Given 04/08/17 0801)     Initial Impression / Assessment and Plan / ED Course  I have reviewed the triage vital signs and the nursing notes.  Pertinent labs & imaging results that were available during my care of the patient were reviewed by me and considered in my medical decision making (see chart for details).     31 year old male with history of chronic cervical occipital neuralgia and headaches here with acute on chronic headache. Suspect this is secondary to his migraine/cervicalgia. He denies any fevers, neck stiffness, is not febrile, and headache is similar to his usual headaches and I do not suspect meningitis or encephalitis. He denies any recent trauma, has no anterior neck pain, and his quality of neck pain is exactly similar to his chronic neck pain and I do not suspect acute bony pathology or carotid or vertebral dissection. He has no focal neurological deficits to suggest intracranial mass lesion. Will give fluids, anti-inflammatories, Compazine, and reassess.  Patient feels markedly improved after migraine medications. His vital signs are improved as well. He remains afebrile. Suspect acute on chronic headache secondary to known neuralgia. Will discharge with continued anti-inflammatories and supportive care with neurology follow-up.  This note was prepared with assistance of Systems analyst. Occasional wrong-word or sound-a-like substitutions may have occurred due to the inherent limitations of voice recognition software.   Final Clinical Impressions(s) / ED Diagnoses   Final diagnoses:  Migraine  without aura and without status migrainosus, not intractable    New Prescriptions New Prescriptions   BUTALBITAL-ACETAMINOPHEN-CAFFEINE (FIORICET, ESGIC) 50-325-40 MG TABLET    Take 1-2 tablets by mouth every 6 (six) hours as needed for headache.     Duffy Bruce, MD 04/08/17 Larinda Buttery    Duffy Bruce, MD 04/08/17 [phone]

## 2017-04-08 NOTE — ED Notes (Signed)
Bed: WTR6 Expected date:  Expected time:  Means of arrival:  Comments: 

## 2017-04-15 DIAGNOSIS — G43909 Migraine, unspecified, not intractable, without status migrainosus: Secondary | ICD-10-CM | POA: Diagnosis not present

## 2018-04-22 ENCOUNTER — Other Ambulatory Visit: Payer: Self-pay

## 2018-04-22 ENCOUNTER — Emergency Department (HOSPITAL_COMMUNITY): Payer: 59

## 2018-04-22 ENCOUNTER — Inpatient Hospital Stay (HOSPITAL_COMMUNITY)
Admission: EM | Admit: 2018-04-22 | Discharge: 2018-05-01 | DRG: 871 | Disposition: A | Discharge status: Home / Self Care | Payer: 59 | Attending: Internal Medicine | Admitting: Internal Medicine

## 2018-04-22 ENCOUNTER — Encounter (HOSPITAL_COMMUNITY): Payer: Self-pay | Admitting: Emergency Medicine

## 2018-04-22 DIAGNOSIS — Z22322 Carrier or suspected carrier of Methicillin resistant Staphylococcus aureus: Secondary | ICD-10-CM | POA: Diagnosis not present

## 2018-04-22 DIAGNOSIS — F319 Bipolar disorder, unspecified: Secondary | ICD-10-CM | POA: Diagnosis present

## 2018-04-22 DIAGNOSIS — Z7951 Long term (current) use of inhaled steroids: Secondary | ICD-10-CM

## 2018-04-22 DIAGNOSIS — F429 Obsessive-compulsive disorder, unspecified: Secondary | ICD-10-CM | POA: Diagnosis present

## 2018-04-22 DIAGNOSIS — A419 Sepsis, unspecified organism: Principal | ICD-10-CM | POA: Diagnosis present

## 2018-04-22 DIAGNOSIS — J329 Chronic sinusitis, unspecified: Secondary | ICD-10-CM | POA: Diagnosis present

## 2018-04-22 DIAGNOSIS — E876 Hypokalemia: Secondary | ICD-10-CM | POA: Diagnosis not present

## 2018-04-22 DIAGNOSIS — J4530 Mild persistent asthma, uncomplicated: Secondary | ICD-10-CM | POA: Diagnosis present

## 2018-04-22 DIAGNOSIS — E860 Dehydration: Secondary | ICD-10-CM | POA: Diagnosis present

## 2018-04-22 DIAGNOSIS — Z87891 Personal history of nicotine dependence: Secondary | ICD-10-CM | POA: Diagnosis not present

## 2018-04-22 DIAGNOSIS — K521 Toxic gastroenteritis and colitis: Secondary | ICD-10-CM | POA: Diagnosis not present

## 2018-04-22 DIAGNOSIS — Z7151 Drug abuse counseling and surveillance of drug abuser: Secondary | ICD-10-CM | POA: Diagnosis not present

## 2018-04-22 DIAGNOSIS — J45909 Unspecified asthma, uncomplicated: Secondary | ICD-10-CM | POA: Diagnosis not present

## 2018-04-22 DIAGNOSIS — Z79899 Other long term (current) drug therapy: Secondary | ICD-10-CM

## 2018-04-22 DIAGNOSIS — J189 Pneumonia, unspecified organism: Secondary | ICD-10-CM | POA: Diagnosis present

## 2018-04-22 DIAGNOSIS — S27309A Unspecified injury of lung, unspecified, initial encounter: Secondary | ICD-10-CM

## 2018-04-22 DIAGNOSIS — F419 Anxiety disorder, unspecified: Secondary | ICD-10-CM | POA: Diagnosis present

## 2018-04-22 DIAGNOSIS — J3089 Other allergic rhinitis: Secondary | ICD-10-CM | POA: Diagnosis present

## 2018-04-22 DIAGNOSIS — J9601 Acute respiratory failure with hypoxia: Secondary | ICD-10-CM | POA: Diagnosis not present

## 2018-04-22 DIAGNOSIS — F1290 Cannabis use, unspecified, uncomplicated: Secondary | ICD-10-CM | POA: Diagnosis not present

## 2018-04-22 DIAGNOSIS — J80 Acute respiratory distress syndrome: Secondary | ICD-10-CM | POA: Diagnosis not present

## 2018-04-22 DIAGNOSIS — J309 Allergic rhinitis, unspecified: Secondary | ICD-10-CM | POA: Diagnosis present

## 2018-04-22 DIAGNOSIS — Z8249 Family history of ischemic heart disease and other diseases of the circulatory system: Secondary | ICD-10-CM

## 2018-04-22 DIAGNOSIS — T3695XA Adverse effect of unspecified systemic antibiotic, initial encounter: Secondary | ICD-10-CM | POA: Diagnosis not present

## 2018-04-22 DIAGNOSIS — J9690 Respiratory failure, unspecified, unspecified whether with hypoxia or hypercapnia: Secondary | ICD-10-CM | POA: Diagnosis not present

## 2018-04-22 DIAGNOSIS — R0602 Shortness of breath: Secondary | ICD-10-CM | POA: Diagnosis not present

## 2018-04-22 DIAGNOSIS — R05 Cough: Secondary | ICD-10-CM | POA: Diagnosis not present

## 2018-04-22 DIAGNOSIS — R51 Headache: Secondary | ICD-10-CM | POA: Diagnosis not present

## 2018-04-22 DIAGNOSIS — F1210 Cannabis abuse, uncomplicated: Secondary | ICD-10-CM | POA: Diagnosis present

## 2018-04-22 LAB — CBC WITH DIFFERENTIAL/PLATELET
BASOS ABS: 0 10*3/uL (ref 0.0–0.1)
BASOS PCT: 0 %
EOS ABS: 0 10*3/uL (ref 0.0–0.7)
Eosinophils Relative: 0 %
HCT: 42.6 % (ref 39.0–52.0)
Hemoglobin: 15.4 g/dL (ref 13.0–17.0)
Lymphocytes Relative: 7 %
Lymphs Abs: 1.2 10*3/uL (ref 0.7–4.0)
MCH: 31.6 pg (ref 26.0–34.0)
MCHC: 36.2 g/dL — AB (ref 30.0–36.0)
MCV: 87.3 fL (ref 78.0–100.0)
MONO ABS: 0.5 10*3/uL (ref 0.1–1.0)
MONOS PCT: 3 %
NEUTROS PCT: 90 %
Neutro Abs: 14.7 10*3/uL — ABNORMAL HIGH (ref 1.7–7.7)
Platelets: 320 10*3/uL (ref 150–400)
RBC: 4.88 MIL/uL (ref 4.22–5.81)
RDW: 12.1 % (ref 11.5–15.5)
WBC: 16.4 10*3/uL — ABNORMAL HIGH (ref 4.0–10.5)

## 2018-04-22 LAB — COMPREHENSIVE METABOLIC PANEL
ALBUMIN: 4.2 g/dL (ref 3.5–5.0)
ALT: 16 U/L (ref 0–44)
ANION GAP: 17 — AB (ref 5–15)
AST: 22 U/L (ref 15–41)
Alkaline Phosphatase: 107 U/L (ref 38–126)
BILIRUBIN TOTAL: 2 mg/dL — AB (ref 0.3–1.2)
BUN: 15 mg/dL (ref 6–20)
CO2: 23 mmol/L (ref 22–32)
Calcium: 10 mg/dL (ref 8.9–10.3)
Chloride: 101 mmol/L (ref 98–111)
Creatinine, Ser: 0.79 mg/dL (ref 0.61–1.24)
GFR calc non Af Amer: >60 mL/min (ref >60)
GLUCOSE: 105 mg/dL — AB (ref 70–99)
POTASSIUM: 3.7 mmol/L (ref 3.5–5.1)
SODIUM: 141 mmol/L (ref 135–145)
TOTAL PROTEIN: 8.9 g/dL — AB (ref 6.5–8.1)

## 2018-04-22 LAB — I-STAT TROPONIN, ED: Troponin i, poc: 0 ng/mL (ref 0.00–0.08)

## 2018-04-22 LAB — D-DIMER, QUANTITATIVE: D-Dimer, Quant: 0.38 ug/mL-FEU (ref 0.00–0.50)

## 2018-04-22 LAB — I-STAT CG4 LACTIC ACID, ED: LACTIC ACID, VENOUS: 1.27 mmol/L (ref 0.5–1.9)

## 2018-04-22 MED ORDER — SERTRALINE HCL 100 MG PO TABS
100.0000 mg | ORAL_TABLET | Freq: Every day | ORAL | Status: DC
  Administered 2018-04-22 – 2018-04-23 (×2): 100 mg via ORAL
  Filled 2018-04-22 (×2): qty 1

## 2018-04-22 MED ORDER — ONDANSETRON HCL 4 MG/2ML IJ SOLN
4.0000 mg | Freq: Four times a day (QID) | INTRAMUSCULAR | Status: DC | PRN
  Administered 2018-04-23 – 2018-04-27 (×4): 4 mg via INTRAVENOUS
  Filled 2018-04-22 (×4): qty 2

## 2018-04-22 MED ORDER — SODIUM CHLORIDE 0.9 % IV BOLUS
1000.0000 mL | Freq: Once | INTRAVENOUS | Status: AC
  Administered 2018-04-22: 1000 mL via INTRAVENOUS

## 2018-04-22 MED ORDER — ONDANSETRON HCL 4 MG PO TABS: 4.0000 mg | ORAL_TABLET | Freq: Four times a day (QID) | ORAL | Status: DC | PRN

## 2018-04-22 MED ORDER — ACETAMINOPHEN 325 MG PO TABS
650.0000 mg | ORAL_TABLET | Freq: Four times a day (QID) | ORAL | Status: DC | PRN
  Administered 2018-04-22 – 2018-04-24 (×2): 650 mg via ORAL
  Filled 2018-04-22 (×2): qty 2

## 2018-04-22 MED ORDER — IPRATROPIUM-ALBUTEROL 0.5-2.5 (3) MG/3ML IN SOLN
3.0000 mL | Freq: Four times a day (QID) | RESPIRATORY_TRACT | Status: DC
  Administered 2018-04-22 – 2018-04-24 (×7): 3 mL via RESPIRATORY_TRACT
  Filled 2018-04-22 (×7): qty 3

## 2018-04-22 MED ORDER — ENSURE ENLIVE PO LIQD
237.0000 mL | Freq: Two times a day (BID) | ORAL | Status: DC
  Administered 2018-04-23 (×2): 237 mL via ORAL

## 2018-04-22 MED ORDER — OXYCODONE-ACETAMINOPHEN 5-325 MG PO TABS
1.0000 | ORAL_TABLET | Freq: Once | ORAL | Status: AC
  Administered 2018-04-22: 1 via ORAL
  Filled 2018-04-22: qty 1

## 2018-04-22 MED ORDER — SODIUM CHLORIDE 0.9% FLUSH
3.0000 mL | Freq: Two times a day (BID) | INTRAVENOUS | Status: DC
  Administered 2018-04-25 – 2018-05-01 (×8): 3 mL via INTRAVENOUS

## 2018-04-22 MED ORDER — SODIUM CHLORIDE 0.9 % IV SOLN
500.0000 mg | INTRAVENOUS | Status: DC
  Administered 2018-04-23: 500 mg via INTRAVENOUS
  Filled 2018-04-22 (×2): qty 500

## 2018-04-22 MED ORDER — TRAZODONE HCL 50 MG PO TABS
50.0000 mg | ORAL_TABLET | Freq: Every evening | ORAL | Status: DC | PRN
  Administered 2018-04-23: 50 mg via ORAL
  Filled 2018-04-22: qty 1

## 2018-04-22 MED ORDER — SODIUM CHLORIDE 0.9 % IV SOLN
1.0000 g | INTRAVENOUS | Status: DC
  Administered 2018-04-22 – 2018-04-24 (×3): 1 g via INTRAVENOUS
  Filled 2018-04-22 (×3): qty 1

## 2018-04-22 MED ORDER — SODIUM CHLORIDE 0.9 % IV SOLN
INTRAVENOUS | Status: DC
  Administered 2018-04-22 – 2018-04-27 (×14): via INTRAVENOUS

## 2018-04-22 MED ORDER — ALBUTEROL SULFATE (2.5 MG/3ML) 0.083% IN NEBU
2.5000 mg | INHALATION_SOLUTION | RESPIRATORY_TRACT | Status: DC | PRN
  Administered 2018-04-23: 2.5 mg via RESPIRATORY_TRACT
  Filled 2018-04-22: qty 3

## 2018-04-22 MED ORDER — HEPARIN SODIUM (PORCINE) 5000 UNIT/ML IJ SOLN
5000.0000 [IU] | Freq: Three times a day (TID) | INTRAMUSCULAR | Status: DC
  Administered 2018-04-24: 5000 [IU] via SUBCUTANEOUS
  Filled 2018-04-22 (×3): qty 1

## 2018-04-22 MED ORDER — SODIUM CHLORIDE 0.9% FLUSH: 3.0000 mL | INTRAVENOUS | Status: DC | PRN

## 2018-04-22 MED ORDER — METAXALONE 800 MG PO TABS
400.0000 mg | ORAL_TABLET | Freq: Three times a day (TID) | ORAL | Status: DC | PRN
  Administered 2018-04-23 – 2018-04-24 (×3): 400 mg via ORAL
  Filled 2018-04-22 (×3): qty 0.5
  Filled 2018-04-22: qty 1
  Filled 2018-04-22 (×2): qty 0.5

## 2018-04-22 MED ORDER — POLYETHYLENE GLYCOL 3350 17 G PO PACK: 17.0000 g | PACK | Freq: Every day | ORAL | Status: DC | PRN

## 2018-04-22 MED ORDER — ACETAMINOPHEN 650 MG RE SUPP: 650.0000 mg | Freq: Four times a day (QID) | RECTAL | Status: DC | PRN

## 2018-04-22 MED ORDER — LORAZEPAM 2 MG/ML IJ SOLN
1.0000 mg | Freq: Once | INTRAMUSCULAR | Status: AC
  Administered 2018-04-22: 1 mg via INTRAVENOUS
  Filled 2018-04-22: qty 1

## 2018-04-22 MED ORDER — LAMOTRIGINE 100 MG PO TABS
100.0000 mg | ORAL_TABLET | Freq: Every day | ORAL | Status: DC
  Administered 2018-04-22 – 2018-04-23 (×2): 100 mg via ORAL
  Filled 2018-04-22 (×2): qty 1

## 2018-04-22 MED ORDER — LEVOFLOXACIN IN D5W 500 MG/100ML IV SOLN
500.0000 mg | Freq: Once | INTRAVENOUS | Status: AC
  Administered 2018-04-22: 500 mg via INTRAVENOUS
  Filled 2018-04-22: qty 100

## 2018-04-22 MED ORDER — SODIUM CHLORIDE 0.9 % IV SOLN
250.0000 mL | INTRAVENOUS | Status: DC | PRN
  Administered 2018-04-24: 10 mL via INTRAVENOUS

## 2018-04-22 MED ORDER — GUAIFENESIN ER 600 MG PO TB12
600.0000 mg | ORAL_TABLET | Freq: Three times a day (TID) | ORAL | Status: DC
  Administered 2018-04-22 – 2018-04-24 (×7): 600 mg via ORAL
  Filled 2018-04-22 (×7): qty 1

## 2018-04-22 MED ORDER — ALPRAZOLAM 0.25 MG PO TABS
0.2500 mg | ORAL_TABLET | Freq: Every day | ORAL | Status: DC | PRN
  Administered 2018-04-23: 0.25 mg via ORAL
  Filled 2018-04-22: qty 1

## 2018-04-22 MED ORDER — ONDANSETRON HCL 4 MG/2ML IJ SOLN
4.0000 mg | Freq: Once | INTRAMUSCULAR | Status: AC
Start: 2018-04-22 — End: 2018-04-22
  Administered 2018-04-22: 4 mg via INTRAVENOUS
  Filled 2018-04-22: qty 2

## 2018-04-22 NOTE — ED Notes (Signed)
Patient transported to X-ray 

## 2018-04-22 NOTE — ED Notes (Signed)
ED TO INPATIENT HANDOFF REPORT  Name/Age/Gender Eric [name] 32 y.o. male  Code Status   Home/SNF/Other Home  Chief Complaint chest pain/shob/fever/migraine   Level of Care/Admitting Diagnosis ED Disposition    ED Disposition Condition [facility] Area: [facility] [[id number]]  Level of Care: Telemetry [5]  Admit to tele based on following criteria: Other see comments  Comments: Hypoxia  Diagnosis: Acute respiratory failure with hypoxia [facility]) [[id number]]  Admitting Physician: Morrison Old  Attending Physician: Morrison Old  Bed request comments: tele  PT Class (Do Not Modify): Observation [104]  PT Acc Code (Do Not Modify): Observation [10022]       Medical History Past Medical History:  Diagnosis Date  . Anxiety   . Bipolar disorder ([place])   . Depression   . Headache(784.0)    migraines  . OCD (obsessive compulsive disorder)   . Parotid mass   . Sebaceous cyst     Allergies No Known Allergies  IV Location/Drains/Wounds Patient Lines/Drains/Airways Status   Active Line/Drains/Airways    Name:   Placement date:   Placement time:   Site:   Days:   Peripheral IV 01/18/16 Left Antecubital   01/18/16    1100    Antecubital   825   Peripheral IV 04/22/18 Left Arm   04/22/18    1040    Arm   less than 1   Incision (Closed) 03/12/16 Abdomen Left   03/12/16    1344     771          Labs/Imaging Results for orders placed or performed during the hospital encounter of 04/22/18 (from the past 48 hour(s))  CBC with Differential/Platelet     Status: Abnormal   Collection Time: 04/22/18 10:46 AM  Result Value Ref Range   WBC 16.4 (H) 4.0 - 10.5 K/uL   RBC 4.88 4.22 - 5.81 MIL/uL   Hemoglobin 15.4 13.0 - 17.0 g/dL   HCT 42.6 39.0 - 52.0 %   MCV 87.3 78.0 - 100.0 fL   MCH 31.6 26.0 - 34.0 pg   MCHC 36.2 (H) 30.0 - 36.0 g/dL   RDW 12.1 11.5 - 15.5 %   Platelets 320 150 - 400 K/uL   Neutrophils Relative % 90 %    Neutro Abs 14.7 (H) 1.7 - 7.7 K/uL   Lymphocytes Relative 7 %   Lymphs Abs 1.2 0.7 - 4.0 K/uL   Monocytes Relative 3 %   Monocytes Absolute 0.5 0.1 - 1.0 K/uL   Eosinophils Relative 0 %   Eosinophils Absolute 0.0 0.0 - 0.7 K/uL   Basophils Relative 0 %   Basophils Absolute 0.0 0.0 - 0.1 K/uL    Comment: Performed at [facility], [place] [address], [place],  [address]  Comprehensive metabolic panel     Status: Abnormal   Collection Time: 04/22/18 10:46 AM  Result Value Ref Range   Sodium 141 135 - 145 mmol/L   Potassium 3.7 3.5 - 5.1 mmol/L   Chloride 101 98 - 111 mmol/L   CO2 23 22 - 32 mmol/L   Glucose, Bld 105 (H) 70 - 99 mg/dL   BUN 15 6 - 20 mg/dL   Creatinine, Ser 0.79 0.61 - 1.24 mg/dL   Calcium 10.0 8.9 - 10.3 mg/dL   Total Protein 8.9 (H) 6.5 - 8.1 g/dL   Albumin 4.2 3.5 - 5.0 g/dL   AST 22 15 - 41 U/L   ALT  16 0 - 44 U/L   Alkaline Phosphatase 107 38 - 126 U/L   Total Bilirubin 2.0 (H) 0.3 - 1.2 mg/dL   GFR calc non Af Amer >60 >60 mL/min   GFR calc Af Amer >60 >60 mL/min    Comment: (NOTE) The eGFR has been calculated using the CKD EPI equation. This calculation has not been validated in all clinical situations. eGFR's persistently <60 mL/min signify possible Chronic Kidney Disease.    Anion gap 17 (H) 5 - 15    Comment: Performed at [facility], [place] [address] [place], [place] [address]  D-dimer, quantitative (not at [facility])     Status: None   Collection Time: 04/22/18 10:46 AM  Result Value Ref Range   D-Dimer, Quant 0.38 0.00 - 0.50 ug/mL-FEU    Comment: (NOTE) At the manufacturer cut-off of 0.50 ug/mL FEU, this assay has been documented to exclude PE with a sensitivity and negative predictive value of 97 to 99%.  At this time, this assay has not been approved by the FDA to exclude DVT/VTE. Results should be correlated with clinical presentation. Performed at [facility], [place] [address], [place], [place] [address]   I-stat troponin, ED     Status: None   Collection Time: 04/22/18 11:05 AM  Result Value Ref Range   Troponin i, poc 0.00 0.00 - 0.08 ng/mL   Comment 3            Comment: Due to the release kinetics of cTnI, a negative result within the first hours of the onset of symptoms does not rule out myocardial infarction with certainty. If myocardial infarction is still suspected, repeat the test at appropriate intervals.   I-Stat CG4 Lactic Acid, ED     Status: None   Collection Time: 04/22/18 11:07 AM  Result Value Ref Range   Lactic Acid, Venous 1.27 0.5 - 1.9 mmol/L   Dg Chest 2 View  Result Date: 04/22/2018 CLINICAL DATA:  Shortness of breath EXAM: CHEST - 2 VIEW COMPARISON:  None. FINDINGS: There is patchy airspace consolidation in the lower lobes bilaterally, slightly more on the right than on the left. Lungs elsewhere are clear. The heart size and pulmonary vascular normal. No adenopathy. No bone lesions. IMPRESSION: Patchy bibasilar infiltrate, slightly more on the right than on the left, felt to represent pneumonia. Lungs elsewhere clear. No adenopathy evident. Electronically Signed   By: Lowella Grip III M.D.   On: 04/22/2018 13:50    Pending Labs Unresulted Labs (From admission, onward)    Start     Ordered   04/22/18 1532  Culture, blood (Routine X 2) w Reflex to ID Panel  BLOOD CULTURE X 2,   R    Question:  Patient immune status  Answer:  Normal   04/22/18 1531   Signed and Held  HIV antibody (Routine Testing)  Once,   R     Signed and Held   Signed and Held  Basic metabolic panel  Tomorrow morning,   R     Signed and Held   Signed and Held  CBC  Tomorrow morning,   R     Signed and Held          Vitals/Pain Today's Vitals   04/22/18 1430 04/22/18 1500 04/22/18 1530 04/22/18 1531  BP: 132/89 135/87 137/88 137/88  Pulse: (!) 124 (!) 141 (!) 129 (!) 130  Resp: (!) 25 (!) 28 (!) 37 (!) 36  Temp:  TempSrc:      SpO2: 91% (!) 88%  95% 95%  Weight:      Height:      PainSc:        Isolation Precautions No active isolations  Medications Medications  cefTRIAXone (ROCEPHIN) 1 g in sodium chloride 0.9 % 100 mL IVPB (has no administration in time range)  azithromycin (ZITHROMAX) 500 mg in sodium chloride 0.9 % 250 mL IVPB (has no administration in time range)  sodium chloride 0.9 % bolus 1,000 mL (0 mLs Intravenous Stopped 04/22/18 1255)  LORazepam (ATIVAN) injection 1 mg (1 mg Intravenous Given 04/22/18 1050)  ondansetron (ZOFRAN) injection 4 mg (4 mg Intravenous Given 04/22/18 1050)  oxyCODONE-acetaminophen (PERCOCET/ROXICET) 5-325 MG per tablet 1 tablet (1 tablet Oral Given 04/22/18 1359)  levofloxacin (LEVAQUIN) IVPB 500 mg (0 mg Intravenous Stopped 04/22/18 1550)    Mobility walks

## 2018-04-22 NOTE — H&P (Signed)
Patient Demographics:    Nashid Pellum, is a 32 y.o. male  MRN: [id number]   DOB - 01/23/1986  Admit Date - 04/22/2018  Outpatient Primary MD for the patient is Tisovec, Fransico Him, MD   Assessment & Plan:    Principal Problem:   Acute respiratory failure with hypoxia ([place]) Active Problems:   CAP (community acquired pneumonia)/Bilateral   Mild persistent asthma   Marijuana user/Vapes THC   Plan:- 1)Acute Hypoxic Respiratory Failure secondary to bilateral community acquired pneumonia--patient is found to be hypoxic with O2 sats in the 86% range at rest on room air, give supplemental oxygen, give mucolytics and bronchodilators, treat empirically with IV Rocephin and azithromycin, leukocytosis with white count above 16,000 noted, tachypnea and tachycardia noted, heart rate is above 130 respiratory rate is above 30.  D-dimer is not elevated, however if respiratory symptoms failed to improve despite adequate treatment of pneumonia consider CTA chest to rule out PE.   2)THC Abuse----patient is advised to refrain from vaping  3)Bipolar Disorder--stable at this time, continue Zoloft 100 mg nightly, Lamictal 100 mg nightly, and Xanax as needed  4) sepsis secondary to bilateral pneumonia--- patient presented with hypoxia, respiratory failure,  tachypnea , tachycardia and leukocytosis as well as reported fevers--- treat empirically as above #1 pending blood cultures  With History of - Reviewed by me  Past Medical History:  Diagnosis Date  . Anxiety   . Bipolar disorder ([place])   . Depression   . Headache(784.0)    migraines  . OCD (obsessive compulsive disorder)   . Parotid mass   . Sebaceous cyst       Past Surgical History:  Procedure Laterality Date  . Excision Benign Cyst    . FOREIGN BODY REMOVAL ABDOMINAL Left  03/12/2016   Procedure: EXCISION  FOREIGN BODY FROM  ABDOMINAL WALL;  Surgeon: Coralie Keens, MD;  Location: [place];  Service: General;  Laterality: Left;  . WISDOM TOOTH EXTRACTION Right     Chief Complaint  Patient presents with  . Shortness of Breath  . Headache      HPI:    Fredy Gladu  is a 32 y.o. male with past medical history relevant for bipolar disorder, mutant asthma who presents with 2 to 3 days of cough, fevers, shortness of breath fatigue and malaise,  No sick contacts, no recent travels, patient has nausea but no vomiting  Additional history obtained from patient's mother at bedside  Patient has no prior history of hospitalizations as an adult, no prior history of ED visits for asthma no prior intubations or ICU admissions,   He was in his usual state of health until 2 to 3 days ago when he started to have respiratory symptoms  In ED--- clinical exam and chest x-ray suggest bilateral pneumonia,  In ED--- patient was hypoxic with O2 sats around 86% on room air at rest, respiratory rate in the 30s heart rate above 120,  he received IV antibiotics and IV fluids tachycardia tachypnea chills fevers and hypoxia persisted    Review of systems:    In addition to the HPI above,   A full Review of  Systems was done, all other systems reviewed are negative except as noted above in HPI , .    Social History:  Reviewed by me    Social History   Tobacco Use  . Smoking status: Former Smoker    Types: Cigarettes    Last attempt to quit: 01/05/2005    Years since quitting: 13.3  . Smokeless tobacco: Never Used  Substance Use Topics  . Alcohol use: Yes    Alcohol/week: 0.0 standard drinks    Comment: occassionally       Family History :  Reviewed by me  HTN   Home Medications:   Prior to Admission medications   Medication Sig Start Date End Date Taking? Authorizing Provider  ALPRAZolam Duanne Moron) 0.25 MG tablet Take 0.25 mg by mouth daily  as needed for anxiety.  08/20/15  Yes [provider]  cyclobenzaprine (FLEXERIL) 10 MG tablet Take 1 tablet (10 mg total) by mouth 2 (two) times daily as needed for muscle spasms. Patient taking differently: Take 10-20 mg by mouth 2 (two) times daily as needed for muscle spasms.  08/30/12  Yes Bailey Mech E, PA-C  lamoTRIgine (LAMICTAL) 100 MG tablet Take 100 mg by mouth at bedtime.  04/11/18  Yes [provider]  metaxalone (SKELAXIN) 800 MG tablet Take 400-800 mg by mouth 3 (three) times daily as needed for muscle spasms.  07/28/15  Yes [provider]  sertraline (ZOLOFT) 100 MG tablet Take 100 mg by mouth at bedtime. 04/16/18  Yes [provider]  ARNUITY ELLIPTA 100 MCG/ACT AEPB INHALE 1 PUFF INTO THE LUNGS DAILY. Patient not taking: No sig reported 07/03/16   Bobbitt, Sedalia Muta, MD  citalopram (CELEXA) 20 MG tablet Take 1 tablet (20 mg total) by mouth daily. 03/15/12 03/12/16  Robyn Haber, MD  fluticasone Asencion Islam) 50 MCG/ACT nasal spray Use 2 sprays per nostril once daily as needed Patient not taking: Reported on 04/22/2018 11/18/15   Bobbitt, Sedalia Muta, MD  HYDROcodone-acetaminophen (NORCO) 5-325 MG tablet Take 1-2 tablets by mouth every 4 (four) hours as needed for moderate pain. Patient not taking: Reported on 04/22/2018 03/12/16   Coralie Keens, MD  levocetirizine (XYZAL) 5 MG tablet TAKE 1 TABLET (5 MG TOTAL) BY MOUTH DAILY. Patient not taking: No sig reported 02/20/16   Bobbitt, Sedalia Muta, MD  ondansetron (ZOFRAN) 4 MG tablet Take 1 tablet (4 mg total) by mouth every 8 (eight) hours as needed for nausea or vomiting. Patient not taking: Reported on 04/22/2018 02/03/15   Domenic Moras, PA-C  triazolam (HALCION) 0.25 MG tablet Take 1 tablet (0.25 mg total) by mouth at bedtime as needed. Patient taking differently: Take 0.25 mg by mouth at bedtime as needed for sleep.  04/28/12 01/18/16  Robyn Haber, MD     Allergies:    No Known Allergies    Physical Exam:   Vitals  Blood pressure 137/88, pulse (!) 130, temperature 99.8 F (37.7 C), temperature source Oral, resp. rate (!) 36, height 5\' 11"  (1.803 m), weight 81.6 kg, SpO2 95 %.  Physical Examination: General appearance - alert, ill appearing , speaking in short sentences Mental status - alert, oriented to person, place, and time, Eyes - sclera anicteric Nose- [place] 2 L/min Neck - supple, no JVD elevation , Chest -diminished in  both bases, no wheezing few scattered rhonchi Heart - S1 and S2 normal, regular but tachycardic Abdomen - soft, nontender, nondistended, no masses or organomegaly Neurological - screening mental status exam normal, neck supple without rigidity, cranial nerves II through XII intact, DTR's normal and symmetric Extremities - no pedal edema noted, intact peripheral pulses Skin - warm, dry     Data Review:    CBC Recent Labs  Lab 04/22/18 1046  WBC 16.4*  HGB 15.4  HCT 42.6  PLT 320  MCV 87.3  MCH 31.6  MCHC 36.2*  RDW 12.1  LYMPHSABS 1.2  MONOABS 0.5  EOSABS 0.0  BASOSABS 0.0   ------------------------------------------------------------------------------------------------------------------  Chemistries  Recent Labs  Lab 04/22/18 1046  NA 141  K 3.7  CL 101  CO2 23  GLUCOSE 105*  BUN 15  CREATININE 0.79  CALCIUM 10.0  AST 22  ALT 16  ALKPHOS 107  BILITOT 2.0*   ------------------------------------------------------------------------------------------------------------------ estimated creatinine clearance is 138.6 mL/min (by C-G formula based on SCr of 0.79 mg/dL). ------------------------------------------------------------------------------------------------------------------ No results for input(s): TSH, T4TOTAL, T3FREE, THYROIDAB in the last 72 hours.  Invalid input(s): FREET3   Coagulation profile No results for input(s): INR, PROTIME in the last 168  hours. ------------------------------------------------------------------------------------------------------------------- Recent Labs    04/22/18 1046  DDIMER 0.38   -------------------------------------------------------------------------------------------------------------------  Cardiac Enzymes No results for input(s): CKMB, TROPONINI, MYOGLOBIN in the last 168 hours.  Invalid input(s): CK ------------------------------------------------------------------------------------------------------------------ No results found for: BNP   ---------------------------------------------------------------------------------------------------------------  Urinalysis No results found for: COLORURINE, APPEARANCEUR, LABSPEC, PHURINE, GLUCOSEU, HGBUR, BILIRUBINUR, KETONESUR, PROTEINUR, UROBILINOGEN, NITRITE, LEUKOCYTESUR ----------------------------------------------------------------------------------------------------------------   Imaging Results:    Dg Chest 2 View  Result Date: 04/22/2018 CLINICAL DATA:  Shortness of breath EXAM: CHEST - 2 VIEW COMPARISON:  None. FINDINGS: There is patchy airspace consolidation in the lower lobes bilaterally, slightly more on the right than on the left. Lungs elsewhere are clear. The heart size and pulmonary vascular normal. No adenopathy. No bone lesions. IMPRESSION: Patchy bibasilar infiltrate, slightly more on the right than on the left, felt to represent pneumonia. Lungs elsewhere clear. No adenopathy evident. Electronically Signed   By: Lowella Grip III M.D.   On: 04/22/2018 13:50    Radiological Exams on Admission: Dg Chest 2 View  Result Date: 04/22/2018 CLINICAL DATA:  Shortness of breath EXAM: CHEST - 2 VIEW COMPARISON:  None. FINDINGS: There is patchy airspace consolidation in the lower lobes bilaterally, slightly more on the right than on the left. Lungs elsewhere are clear. The heart size and pulmonary vascular normal. No adenopathy. No bone  lesions. IMPRESSION: Patchy bibasilar infiltrate, slightly more on the right than on the left, felt to represent pneumonia. Lungs elsewhere clear. No adenopathy evident. Electronically Signed   By: Lowella Grip III M.D.   On: 04/22/2018 13:50   DVT Prophylaxis -SCD  /heparin AM Labs Ordered, also please review Full Orders  Family Communication: Admission, patients condition and plan of care including tests being ordered have been discussed with the patient and mother at bedside who indicate understanding and agree with the plan   Code Status - Full Code  Likely DC to  Home   Condition   stable  Roxan Hockey M.D on 04/22/2018 at 3:33 PM Pager---[phone] Go to [email or web address] - password [email or web address] for contact info  [facility] - Office  [phone]

## 2018-04-22 NOTE — ED Triage Notes (Signed)
Patient arrived from home. Patient c/o of shortness of breath that started three days ago and headache that began last night. Patient states " pain gets worst when I take a deep breath". Patient has a hx of migraines.Patient appears to have abnormal breathing.

## 2018-04-22 NOTE — ED Provider Notes (Signed)
[facility] DEPT Provider Note   CSN: [id number] Arrival date & time: 04/22/18  0941     History   Chief Complaint Chief Complaint  Patient presents with  . Shortness of Breath  . Headache    HPI Eric [name] is a 32 y.o. male.  Patient had a cough for couple days.  He complains of chest pain and shortness of breath.  Patient states he has been using vapors cigarettes.  The history is provided by the patient. No language interpreter was used.  Shortness of Breath  This is a new problem. The problem occurs continuously.The current episode started 2 days ago. The problem has not changed since onset.Associated symptoms include headaches and cough. Pertinent negatives include no chest pain, no abdominal pain and no rash. The problem's precipitants include smoke. He has tried nothing for the symptoms. The treatment provided no relief. He has had no prior hospitalizations. He has had no prior ED visits. He has had no prior ICU admissions. Associated medical issues do not include PE.  Headache   Associated symptoms include shortness of breath.    Past Medical History:  Diagnosis Date  . Anxiety   . Bipolar disorder ([place])   . Depression   . Headache(784.0)    migraines  . OCD (obsessive compulsive disorder)   . Parotid mass   . Sebaceous cyst     Patient Active Problem List   Diagnosis Date Noted  . Mild persistent asthma 09/23/2015  . Allergic rhinitis 09/23/2015  . Sebaceous cyst 07/10/2012    Past Surgical History:  Procedure Laterality Date  . Excision Benign Cyst    . FOREIGN BODY REMOVAL ABDOMINAL Left 03/12/2016   Procedure: EXCISION  FOREIGN BODY FROM  ABDOMINAL WALL;  Surgeon: Coralie Keens, MD;  Location: [facility];  Service: General;  Laterality: Left;  . WISDOM TOOTH EXTRACTION Right         Home Medications    Prior to Admission medications   Medication Sig Start Date End Date Taking? Authorizing  Provider  ALPRAZolam Duanne Moron) 0.25 MG tablet Take 0.25 mg by mouth daily as needed for anxiety.  08/20/15  Yes [provider]  cyclobenzaprine (FLEXERIL) 10 MG tablet Take 1 tablet (10 mg total) by mouth 2 (two) times daily as needed for muscle spasms. Patient taking differently: Take 10-20 mg by mouth 2 (two) times daily as needed for muscle spasms.  08/30/12  Yes Bailey Mech E, PA-C  lamoTRIgine (LAMICTAL) 100 MG tablet Take 100 mg by mouth at bedtime.  04/11/18  Yes [provider]  metaxalone (SKELAXIN) 800 MG tablet Take 400-800 mg by mouth 3 (three) times daily as needed for muscle spasms.  07/28/15  Yes [provider]  sertraline (ZOLOFT) 100 MG tablet Take 100 mg by mouth at bedtime. 04/16/18  Yes [provider]  ARNUITY ELLIPTA 100 MCG/ACT AEPB INHALE 1 PUFF INTO THE LUNGS DAILY. Patient not taking: No sig reported 07/03/16   Bobbitt, Sedalia Muta, MD  citalopram (CELEXA) 20 MG tablet Take 1 tablet (20 mg total) by mouth daily. 03/15/12 03/12/16  Robyn Haber, MD  fluticasone Asencion Islam) 50 MCG/ACT nasal spray Use 2 sprays per nostril once daily as needed Patient not taking: Reported on 04/22/2018 11/18/15   Bobbitt, Sedalia Muta, MD  HYDROcodone-acetaminophen (NORCO) 5-325 MG tablet Take 1-2 tablets by mouth every 4 (four) hours as needed for moderate pain. Patient not taking: Reported on 04/22/2018 03/12/16   Coralie Keens, MD  levocetirizine (XYZAL) 5 MG tablet TAKE 1 TABLET (5 MG TOTAL) BY MOUTH DAILY. Patient not taking: No sig reported 02/20/16   Bobbitt, Sedalia Muta, MD  ondansetron (ZOFRAN) 4 MG tablet Take 1 tablet (4 mg total) by mouth every 8 (eight) hours as needed for nausea or vomiting. Patient not taking: Reported on 04/22/2018 02/03/15   Domenic Moras, PA-C  triazolam (HALCION) 0.25 MG tablet Take 1 tablet (0.25 mg total) by mouth at bedtime as needed. Patient taking differently: Take 0.25 mg by mouth at bedtime as needed for sleep.  04/28/12  01/18/16  Robyn Haber, MD    Family History History reviewed. No pertinent family history.  Social History Social History   Tobacco Use  . Smoking status: Former Smoker    Types: Cigarettes    Last attempt to quit: 01/05/2005    Years since quitting: 13.3  . Smokeless tobacco: Never Used  Substance Use Topics  . Alcohol use: Yes    Alcohol/week: 0.0 standard drinks    Comment: occassionally  . Drug use: No     Allergies   Patient has no known allergies.   Review of Systems Review of Systems  Constitutional: Negative for appetite change and fatigue.  HENT: Negative for congestion, ear discharge and sinus pressure.   Eyes: Negative for discharge.  Respiratory: Positive for cough and shortness of breath.   Cardiovascular: Negative for chest pain.  Gastrointestinal: Negative for abdominal pain and diarrhea.  Genitourinary: Negative for frequency and hematuria.  Musculoskeletal: Negative for back pain.  Skin: Negative for rash.  Neurological: Positive for headaches. Negative for seizures.  Psychiatric/Behavioral: Negative for hallucinations.     Physical Exam Updated Vital Signs BP 131/88   Pulse (!) 132   Temp 99.8 F (37.7 C) (Oral)   Resp (!) 22   Ht 5\' 11"  (1.803 m)   Wt 81.6 kg   SpO2 94%   BMI 25.10 kg/m   Physical Exam  Constitutional: He is oriented to person, place, and time. He appears well-developed.  HENT:  Head: Normocephalic.  Eyes: Conjunctivae and EOM are normal. No scleral icterus.  Neck: Neck supple. No thyromegaly present.  Cardiovascular: Exam reveals no gallop and no friction rub.  No murmur heard. Tachycardic  Pulmonary/Chest: No stridor. He has wheezes. He has no rales. He exhibits no tenderness.  Minimal wheezing  Abdominal: He exhibits no distension. There is no tenderness. There is no rebound.  Musculoskeletal: Normal range of motion. He exhibits no edema.  Lymphadenopathy:    He has no cervical adenopathy.  Neurological:  He is oriented to person, place, and time. He exhibits normal muscle tone. Coordination normal.  Skin: No rash noted. No erythema.  Psychiatric: He has a normal mood and affect. His behavior is normal.     ED Treatments / Results  Labs (all labs ordered are listed, but only abnormal results are displayed) Labs Reviewed  CBC WITH DIFFERENTIAL/PLATELET - Abnormal; Notable for the following components:      Result Value   WBC 16.4 (*)    MCHC 36.2 (*)    Neutro Abs 14.7 (*)    All other components within normal limits  COMPREHENSIVE METABOLIC PANEL - Abnormal; Notable for the following components:   Glucose, Bld 105 (*)    Total Protein 8.9 (*)    Total Bilirubin 2.0 (*)    Anion gap 17 (*)    All other components within normal limits  D-DIMER, QUANTITATIVE (NOT AT [facility])  I-STAT TROPONIN, ED  I-STAT CG4 LACTIC ACID, ED    EKG None  Radiology Dg Chest 2 View  Result Date: 04/22/2018 CLINICAL DATA:  Shortness of breath EXAM: CHEST - 2 VIEW COMPARISON:  None. FINDINGS: There is patchy airspace consolidation in the lower lobes bilaterally, slightly more on the right than on the left. Lungs elsewhere are clear. The heart size and pulmonary vascular normal. No adenopathy. No bone lesions. IMPRESSION: Patchy bibasilar infiltrate, slightly more on the right than on the left, felt to represent pneumonia. Lungs elsewhere clear. No adenopathy evident. Electronically Signed   By: Lowella Grip III M.D.   On: 04/22/2018 13:50    Procedures Procedures (including critical care time)  Medications Ordered in ED Medications  levofloxacin (LEVAQUIN) IVPB 500 mg (500 mg Intravenous New Bag/Given 04/22/18 1405)  sodium chloride 0.9 % bolus 1,000 mL (0 mLs Intravenous Stopped 04/22/18 1255)  LORazepam (ATIVAN) injection 1 mg (1 mg Intravenous Given 04/22/18 1050)  ondansetron (ZOFRAN) injection 4 mg (4 mg Intravenous Given 04/22/18 1050)  oxyCODONE-acetaminophen (PERCOCET/ROXICET) 5-325 MG per tablet  1 tablet (1 tablet Oral Given 04/22/18 1359)     Initial Impression / Assessment and Plan / ED Course  I have reviewed the triage vital signs and the nursing notes.  Pertinent labs & imaging results that were available during my care of the patient were reviewed by me and considered in my medical decision making (see chart for details).     Patient with bilateral pneumonia.  He is tachycardic and tachypneic.  He will be seen by hospitalist for possible admission to the hospital  Final Clinical Impressions(s) / ED Diagnoses   Final diagnoses:  None    ED Discharge Orders    None       Milton Ferguson, MD 04/22/18 1448

## 2018-04-22 NOTE — ED Notes (Signed)
Patient transported by transport tech

## 2018-04-22 NOTE — ED Notes (Signed)
Patient stated that he's uses a vape pen with THC in it. Patient has been using it for 6 months.

## 2018-04-23 LAB — BASIC METABOLIC PANEL
Anion gap: 10 (ref 5–15)
BUN: 10 mg/dL (ref 6–20)
CALCIUM: 8.9 mg/dL (ref 8.9–10.3)
CO2: 25 mmol/L (ref 22–32)
CREATININE: 0.77 mg/dL (ref 0.61–1.24)
Chloride: 103 mmol/L (ref 98–111)
GFR calc Af Amer: >60 mL/min (ref >60)
GLUCOSE: 108 mg/dL — AB (ref 70–99)
Potassium: 4 mmol/L (ref 3.5–5.1)
Sodium: 138 mmol/L (ref 135–145)

## 2018-04-23 LAB — RAPID URINE DRUG SCREEN, HOSP PERFORMED
Amphetamines: NOT DETECTED
BENZODIAZEPINES: NOT DETECTED
Barbiturates: NOT DETECTED
Cocaine: NOT DETECTED
OPIATES: NOT DETECTED
Tetrahydrocannabinol: POSITIVE — AB

## 2018-04-23 LAB — CBC
HCT: 37.2 % — ABNORMAL LOW (ref 39.0–52.0)
Hemoglobin: 12.9 g/dL — ABNORMAL LOW (ref 13.0–17.0)
MCH: 30.6 pg (ref 26.0–34.0)
MCHC: 34.7 g/dL (ref 30.0–36.0)
MCV: 88.4 fL (ref 78.0–100.0)
Platelets: 279 10*3/uL (ref 150–400)
RBC: 4.21 MIL/uL — ABNORMAL LOW (ref 4.22–5.81)
RDW: 12.1 % (ref 11.5–15.5)
WBC: 11.8 10*3/uL — ABNORMAL HIGH (ref 4.0–10.5)

## 2018-04-23 LAB — HIV ANTIBODY (ROUTINE TESTING W REFLEX): HIV SCREEN 4TH GENERATION: NONREACTIVE

## 2018-04-23 MED ORDER — KETOROLAC TROMETHAMINE 30 MG/ML IJ SOLN
30.0000 mg | Freq: Once | INTRAMUSCULAR | Status: AC
  Administered 2018-04-23: 30 mg via INTRAVENOUS
  Filled 2018-04-23: qty 1

## 2018-04-23 MED ORDER — METOPROLOL TARTRATE 5 MG/5ML IV SOLN
5.0000 mg | Freq: Once | INTRAVENOUS | Status: AC
  Administered 2018-04-23: 5 mg via INTRAVENOUS
  Filled 2018-04-23: qty 5

## 2018-04-23 MED ORDER — KETOROLAC TROMETHAMINE 30 MG/ML IJ SOLN
30.0000 mg | Freq: Once | INTRAMUSCULAR | Status: AC
  Administered 2018-04-23: 30 mg via INTRAVENOUS
  Filled 2018-04-23 (×2): qty 1

## 2018-04-23 MED ORDER — BUTALBITAL-APAP-CAFFEINE 50-325-40 MG PO TABS
1.0000 | ORAL_TABLET | Freq: Four times a day (QID) | ORAL | Status: DC | PRN
  Administered 2018-04-23 – 2018-04-24 (×4): 1 via ORAL
  Filled 2018-04-23 (×4): qty 1

## 2018-04-23 MED ORDER — ALPRAZOLAM 0.25 MG PO TABS
0.2500 mg | ORAL_TABLET | Freq: Three times a day (TID) | ORAL | Status: DC | PRN
  Administered 2018-04-24: 0.25 mg via ORAL
  Filled 2018-04-23 (×2): qty 1

## 2018-04-23 MED ORDER — HYDROCOD POLST-CPM POLST ER 10-8 MG/5ML PO SUER: 5.0000 mL | Freq: Every evening | ORAL | Status: DC | PRN

## 2018-04-23 MED ORDER — FLUTICASONE PROPIONATE 50 MCG/ACT NA SUSP
1.0000 | Freq: Two times a day (BID) | NASAL | Status: DC
  Administered 2018-04-23 – 2018-04-24 (×3): 1 via NASAL
  Filled 2018-04-23: qty 16

## 2018-04-23 MED ORDER — PROMETHAZINE HCL 25 MG/ML IJ SOLN
12.5000 mg | Freq: Once | INTRAMUSCULAR | Status: AC
  Administered 2018-04-23: 12.5 mg via INTRAVENOUS
  Filled 2018-04-23: qty 1

## 2018-04-23 MED ORDER — MAGNESIUM SULFATE 2 GM/50ML IV SOLN
2.0000 g | Freq: Once | INTRAVENOUS | Status: AC
  Administered 2018-04-23: 2 g via INTRAVENOUS
  Filled 2018-04-23: qty 50

## 2018-04-23 NOTE — Plan of Care (Signed)
  Problem: Activity: Goal: Ability to tolerate increased activity will improve Outcome: Progressing   Problem: Clinical Measurements: Goal: Ability to maintain a body temperature in the normal range will improve Outcome: Progressing   Problem: Respiratory: Goal: Ability to maintain adequate ventilation will improve Outcome: Progressing Goal: Ability to maintain a clear airway will improve Outcome: Progressing   

## 2018-04-23 NOTE — Progress Notes (Addendum)
PROGRESS NOTE    Eric [name]  CVE:[id number] DOB: May 07, 1986 DOA: 04/22/2018 PCP: Haywood Pao, MD   Brief Narrative:  HPI On 04/22/2018 by Dr. Roxan Hockey Froilan Mclean  is a 32 y.o. male with past medical history relevant for bipolar disorder, mutant asthma who presents with 2 to 3 days of cough, fevers, shortness of breath fatigue and malaise, No sick contacts, no recent travels, patient has nausea but no vomiting Additional history obtained from patient's mother at bedside Patient has no prior history of hospitalizations as an adult, no prior history of ED visits for asthma no prior intubations or ICU admissions He was in his usual state of health until 2 to 3 days ago when he started to have respiratory symptoms  Assessment & Plan   Sepsis secondary to pneumonia -Patient presented with fever, tachycardia, tachypnea with leukocytosis -Chest x-ray shows patchy bibasilar infiltrate, slightly more on the right -Continue azithromycin, ceftriaxone -Blood culture shows no growth to date -Patient continues to have congestion and cough -Will order flutter valve, incentive spirometry -Continue antitussives nightly (Tussionex added) -Will order Flonase for nasal congestion -Continue nebulizer treatments  Acute hypoxic respiratory failure secondary to pneumonia -Upon admission, patient was found to have hypoxia with a oxygen saturation of 86% on room air and at rest -Continue supplemental oxygen as well as treatment listed above  Bipolar disorder/depression/anxiety -Continue Lamictal, Zoloft, Xanax  THC abuse -Admits to using THC vaping -Counseled on cessation -Drug screen +THC  Headache  -Will order toradol, magnesium, phenergan   DVT Prophylaxis  heparin  Code Status: Full  Family Communication: Wife at bedside  Disposition Plan: Admitted. Continues to be inpatient given the use of IV antibiotics, poor intake, hypoxia  Consultants None  Procedures   None  Antibiotics   Anti-infectives (From admission, onward)   Start     Dose/Rate Route Frequency Ordered Stop   04/23/18 1400  azithromycin (ZITHROMAX) 500 mg in sodium chloride 0.9 % 250 mL IVPB     500 mg 250 mL/hr over 60 Minutes Intravenous Every 24 hours 04/22/18 1531     04/22/18 1545  cefTRIAXone (ROCEPHIN) 1 g in sodium chloride 0.9 % 100 mL IVPB     1 g 200 mL/hr over 30 Minutes Intravenous Every 24 hours 04/22/18 1531     04/22/18 1400  levofloxacin (LEVAQUIN) IVPB 500 mg     500 mg 100 mL/hr over 60 Minutes Intravenous  Once 04/22/18 1347 04/22/18 1550      Subjective:   Lavell [name] seen and examined today.  Patient continues to have cough with productive sputum, green and yellow in color.  Also complains of headache along with nasal congestion and neck pain.  Feels his neck is spasming which leads to his headaches.  He also feels it when he coughs his entire body aches and he has more pain with deep inspiration.  Denies current chest pain, abdominal pain, nausea or vomiting, diarrhea or constipation.  Objective:   Vitals:   04/22/18 2015 04/22/18 2116 04/23/18 0535 04/23/18 0751  BP:  131/81 130/87   Pulse:  (!) 127 (!) 113   Resp:  16 18   Temp:  98.5 F (36.9 C) 99.4 F (37.4 C)   TempSrc:  Oral Oral   SpO2: 96% 96% 95% 92%  Weight:      Height:        Intake/Output Summary (Last 24 hours) at 04/23/2018 1035 Last data filed at 04/23/2018 0616 Gross per 24 hour  Intake 2737.23 ml  Output 1000 ml  Net 1737.23 ml   Filed Weights   04/22/18 1013  Weight: 81.6 kg    Exam  General: Well developed, well nourished, NAD, appears stated age  45: NCAT, mucous membranes moist.   Neck: Supple  Cardiovascular: S1 S2 auscultated, no rubs, murmurs or gallops. Regular rate and rhythm.  Respiratory: Diminished breath sounds, cough  Abdomen: Soft, nontender, nondistended, + bowel sounds  Extremities: warm dry without cyanosis clubbing or edema  Neuro:  AAOx3, nonfocal  Psych: Normal affect and demeanor with intact judgement and insight   Data Reviewed: I have personally reviewed following labs and imaging studies  CBC: Recent Labs  Lab 04/22/18 1046 04/23/18 0529  WBC 16.4* 11.8*  NEUTROABS 14.7*  --   HGB 15.4 12.9*  HCT 42.6 37.2*  MCV 87.3 88.4  PLT 320 338   Basic Metabolic Panel: Recent Labs  Lab 04/22/18 1046 04/23/18 0529  NA 141 138  K 3.7 4.0  CL 101 103  CO2 23 25  GLUCOSE 105* 108*  BUN 15 10  CREATININE 0.79 0.77  CALCIUM 10.0 8.9   GFR: Estimated Creatinine Clearance: 138.6 mL/min (by C-G formula based on SCr of 0.77 mg/dL). Liver Function Tests: Recent Labs  Lab 04/22/18 1046  AST 22  ALT 16  ALKPHOS 107  BILITOT 2.0*  PROT 8.9*  ALBUMIN 4.2   No results for input(s): LIPASE, AMYLASE in the last 168 hours. No results for input(s): AMMONIA in the last 168 hours. Coagulation Profile: No results for input(s): INR, PROTIME in the last 168 hours. Cardiac Enzymes: No results for input(s): CKTOTAL, CKMB, CKMBINDEX, TROPONINI in the last 168 hours. BNP (last 3 results) No results for input(s): PROBNP in the last 8760 hours. HbA1C: No results for input(s): HGBA1C in the last 72 hours. CBG: No results for input(s): GLUCAP in the last 168 hours. Lipid Profile: No results for input(s): CHOL, HDL, LDLCALC, TRIG, CHOLHDL, LDLDIRECT in the last 72 hours. Thyroid Function Tests: No results for input(s): TSH, T4TOTAL, FREET4, T3FREE, THYROIDAB in the last 72 hours. Anemia Panel: No results for input(s): VITAMINB12, FOLATE, FERRITIN, TIBC, IRON, RETICCTPCT in the last 72 hours. Urine analysis: No results found for: COLORURINE, APPEARANCEUR, LABSPEC, PHURINE, GLUCOSEU, HGBUR, BILIRUBINUR, KETONESUR, PROTEINUR, UROBILINOGEN, NITRITE, LEUKOCYTESUR Sepsis Labs: @LABRCNTIP (procalcitonin:4,lacticidven:4)  )No results found for this or any previous visit (from the past 240 hour(s)).    Radiology  Studies: Dg Chest 2 View  Result Date: 04/22/2018 CLINICAL DATA:  Shortness of breath EXAM: CHEST - 2 VIEW COMPARISON:  None. FINDINGS: There is patchy airspace consolidation in the lower lobes bilaterally, slightly more on the right than on the left. Lungs elsewhere are clear. The heart size and pulmonary vascular normal. No adenopathy. No bone lesions. IMPRESSION: Patchy bibasilar infiltrate, slightly more on the right than on the left, felt to represent pneumonia. Lungs elsewhere clear. No adenopathy evident. Electronically Signed   By: Lowella Grip III M.D.   On: 04/22/2018 13:50     Scheduled Meds: . feeding supplement (ENSURE ENLIVE)  237 mL Oral BID BM  . fluticasone  1 spray Each Nare BID  . guaiFENesin  600 mg Oral TID  . heparin  5,000 Units Subcutaneous Q8H  . ipratropium-albuterol  3 mL Nebulization QID  . lamoTRIgine  100 mg Oral QHS  . sertraline  100 mg Oral QHS  . sodium chloride flush  3 mL Intravenous Q12H   Continuous Infusions: . sodium chloride    .  sodium chloride 150 mL/hr at 04/23/18 0616  . azithromycin    . cefTRIAXone (ROCEPHIN)  IV Stopped (04/22/18 1904)     LOS: 1 day   Time Spent in minutes   45 minutes (greater than 50% of time spent with patient and wife face to face, answering all questions,  as well as reviewing records, and formulating a plan)  Cristal Ford D.O. on 04/23/2018 at 10:35 AM  Between 7am to 7pm - Please see pager noted on [email or web address]  After 7pm go to [email or web address]  And look for the night coverage person covering for me after hours  [facility] Office  [phone]

## 2018-04-23 NOTE — Progress Notes (Signed)
Pt denies having relief from PO medication given for headache/migraine. Pt requesting IV form medications. Pt and pt's family requesting for Dr. Ree Kida to speak with Dr Pattricia Boss which is ER MD at [facility] per family. Advised for Dr. Pattricia Boss to contact attending. RN informed family.

## 2018-04-23 NOTE — Progress Notes (Signed)
Pt visiting with 5 visitors in room. Visitors laughing and heard through closed door in hallway. RN checked on pt and pt still states his head still hurts but is improving 8/10.

## 2018-04-24 ENCOUNTER — Inpatient Hospital Stay (HOSPITAL_COMMUNITY): Payer: 59

## 2018-04-24 DIAGNOSIS — J80 Acute respiratory distress syndrome: Secondary | ICD-10-CM

## 2018-04-24 DIAGNOSIS — J4530 Mild persistent asthma, uncomplicated: Secondary | ICD-10-CM

## 2018-04-24 DIAGNOSIS — F1290 Cannabis use, unspecified, uncomplicated: Secondary | ICD-10-CM

## 2018-04-24 DIAGNOSIS — J189 Pneumonia, unspecified organism: Secondary | ICD-10-CM

## 2018-04-24 DIAGNOSIS — A419 Sepsis, unspecified organism: Principal | ICD-10-CM

## 2018-04-24 LAB — COMPREHENSIVE METABOLIC PANEL
ALT: 15 U/L (ref 0–44)
AST: 24 U/L (ref 15–41)
Albumin: 2.8 g/dL — ABNORMAL LOW (ref 3.5–5.0)
Alkaline Phosphatase: 87 U/L (ref 38–126)
Anion gap: 12 (ref 5–15)
BUN: 9 mg/dL (ref 6–20)
CHLORIDE: 104 mmol/L (ref 98–111)
CO2: 23 mmol/L (ref 22–32)
CREATININE: 0.86 mg/dL (ref 0.61–1.24)
Calcium: 9.4 mg/dL (ref 8.9–10.3)
GFR calc Af Amer: >60 mL/min (ref >60)
GFR calc non Af Amer: >60 mL/min (ref >60)
Glucose, Bld: 122 mg/dL — ABNORMAL HIGH (ref 70–99)
Potassium: 4.3 mmol/L (ref 3.5–5.1)
Sodium: 139 mmol/L (ref 135–145)
Total Bilirubin: 0.8 mg/dL (ref 0.3–1.2)
Total Protein: 7.5 g/dL (ref 6.5–8.1)

## 2018-04-24 LAB — BASIC METABOLIC PANEL
Anion gap: 11 (ref 5–15)
BUN: 11 mg/dL (ref 6–20)
CALCIUM: 9.1 mg/dL (ref 8.9–10.3)
CO2: 24 mmol/L (ref 22–32)
CREATININE: 0.78 mg/dL (ref 0.61–1.24)
Chloride: 105 mmol/L (ref 98–111)
GFR calc non Af Amer: >60 mL/min (ref >60)
GLUCOSE: 108 mg/dL — AB (ref 70–99)
Potassium: 4.3 mmol/L (ref 3.5–5.1)
Sodium: 140 mmol/L (ref 135–145)

## 2018-04-24 LAB — CBC
HCT: 37.1 % — ABNORMAL LOW (ref 39.0–52.0)
HEMATOCRIT: 36.4 % — AB (ref 39.0–52.0)
Hemoglobin: 12.5 g/dL — ABNORMAL LOW (ref 13.0–17.0)
Hemoglobin: 12.7 g/dL — ABNORMAL LOW (ref 13.0–17.0)
MCH: 30.3 pg (ref 26.0–34.0)
MCH: 30.6 pg (ref 26.0–34.0)
MCHC: 34.3 g/dL (ref 30.0–36.0)
MCHC: 34.2 g/dL (ref 30.0–36.0)
MCV: 88.1 fL (ref 78.0–100.0)
MCV: 89.4 fL (ref 78.0–100.0)
PLATELETS: 304 10*3/uL (ref 150–400)
PLATELETS: 359 10*3/uL (ref 150–400)
RBC: 4.13 MIL/uL — ABNORMAL LOW (ref 4.22–5.81)
RBC: 4.15 MIL/uL — AB (ref 4.22–5.81)
RDW: 12.2 % (ref 11.5–15.5)
RDW: 12.2 % (ref 11.5–15.5)
WBC: 12.1 10*3/uL — ABNORMAL HIGH (ref 4.0–10.5)
WBC: 12.5 10*3/uL — ABNORMAL HIGH (ref 4.0–10.5)

## 2018-04-24 LAB — BLOOD GAS, ARTERIAL
Acid-base deficit: 0.5 mmol/L (ref 0.0–2.0)
BICARBONATE: 23.4 mmol/L (ref 20.0–28.0)
Drawn by: 308601
FIO2: 100
O2 Saturation: 98.8 %
PH ART: 7.411 (ref 7.350–7.450)
PO2 ART: 120 mmHg — AB (ref 83.0–108.0)
Patient temperature: 37
pCO2 arterial: 37.5 mmHg (ref 32.0–48.0)

## 2018-04-24 LAB — BRAIN NATRIURETIC PEPTIDE: B NATRIURETIC PEPTIDE 5: 69.7 pg/mL (ref 0.0–100.0)

## 2018-04-24 LAB — EXPECTORATED SPUTUM ASSESSMENT W GRAM STAIN, RFLX TO RESP C

## 2018-04-24 LAB — GLUCOSE, CAPILLARY: GLUCOSE-CAPILLARY: 120 mg/dL — AB (ref 70–99)

## 2018-04-24 LAB — PROCALCITONIN: Procalcitonin: 1.69 ng/mL

## 2018-04-24 LAB — LACTIC ACID, PLASMA: Lactic Acid, Venous: 1.8 mmol/L (ref 0.5–1.9)

## 2018-04-24 LAB — MRSA PCR SCREENING: MRSA BY PCR: POSITIVE — AB

## 2018-04-24 MED ORDER — IBUPROFEN 200 MG PO TABS
600.0000 mg | ORAL_TABLET | Freq: Once | ORAL | Status: AC
  Administered 2018-04-24: 600 mg via ORAL
  Filled 2018-04-24: qty 3

## 2018-04-24 MED ORDER — PIPERACILLIN-TAZOBACTAM 3.375 G IVPB
3.3750 g | Freq: Three times a day (TID) | INTRAVENOUS | Status: DC
  Administered 2018-04-24 – 2018-05-01 (×20): 3.375 g via INTRAVENOUS
  Filled 2018-04-24 (×22): qty 50

## 2018-04-24 MED ORDER — PHENYLEPHRINE HCL 0.5 % NA SOLN
1.0000 [drp] | Freq: Four times a day (QID) | NASAL | Status: DC | PRN
  Filled 2018-04-24: qty 15

## 2018-04-24 MED ORDER — AZITHROMYCIN 250 MG PO TABS
500.0000 mg | ORAL_TABLET | Freq: Every day | ORAL | Status: DC
  Administered 2018-04-24: 500 mg via ORAL
  Filled 2018-04-24: qty 2

## 2018-04-24 MED ORDER — SODIUM CHLORIDE 0.9 % IV SOLN: 250.0000 mL | INTRAVENOUS | Status: DC | PRN

## 2018-04-24 MED ORDER — ACETAMINOPHEN 10 MG/ML IV SOLN
1000.0000 mg | Freq: Four times a day (QID) | INTRAVENOUS | Status: AC | PRN
  Administered 2018-04-25: 1000 mg via INTRAVENOUS
  Filled 2018-04-24 (×2): qty 100

## 2018-04-24 MED ORDER — METOPROLOL TARTRATE 5 MG/5ML IV SOLN
5.0000 mg | Freq: Once | INTRAVENOUS | Status: AC
  Administered 2018-04-24: 5 mg via INTRAVENOUS
  Filled 2018-04-24: qty 5

## 2018-04-24 MED ORDER — IPRATROPIUM-ALBUTEROL 0.5-2.5 (3) MG/3ML IN SOLN
3.0000 mL | Freq: Four times a day (QID) | RESPIRATORY_TRACT | Status: DC
  Administered 2018-04-24 – 2018-04-30 (×24): 3 mL via RESPIRATORY_TRACT
  Filled 2018-04-24 (×25): qty 3

## 2018-04-24 MED ORDER — SODIUM CHLORIDE 0.9 % IV BOLUS
500.0000 mL | Freq: Once | INTRAVENOUS | Status: AC
  Administered 2018-04-24: 500 mL via INTRAVENOUS

## 2018-04-24 MED ORDER — BUDESONIDE 0.5 MG/2ML IN SUSP
0.5000 mg | Freq: Two times a day (BID) | RESPIRATORY_TRACT | Status: DC
  Administered 2018-04-24 – 2018-05-01 (×14): 0.5 mg via RESPIRATORY_TRACT
  Filled 2018-04-24 (×14): qty 2

## 2018-04-24 MED ORDER — METHYLPREDNISOLONE SODIUM SUCC 125 MG IJ SOLR
60.0000 mg | Freq: Four times a day (QID) | INTRAMUSCULAR | Status: DC
  Administered 2018-04-24 – 2018-04-25 (×3): 60 mg via INTRAVENOUS
  Filled 2018-04-24 (×3): qty 2

## 2018-04-24 MED ORDER — METHYLPREDNISOLONE SODIUM SUCC 125 MG IJ SOLR
60.0000 mg | Freq: Every day | INTRAMUSCULAR | Status: DC
  Administered 2018-04-24: 60 mg via INTRAVENOUS
  Filled 2018-04-24: qty 2

## 2018-04-24 MED ORDER — LORAZEPAM 2 MG/ML IJ SOLN
1.0000 mg | Freq: Once | INTRAMUSCULAR | Status: AC
  Administered 2018-04-24: 1 mg via INTRAVENOUS
  Filled 2018-04-24: qty 1

## 2018-04-24 MED ORDER — METOPROLOL TARTRATE 5 MG/5ML IV SOLN
2.5000 mg | Freq: Once | INTRAVENOUS | Status: AC
  Administered 2018-04-24: 2.5 mg via INTRAVENOUS
  Filled 2018-04-24: qty 5

## 2018-04-24 MED ORDER — IOPAMIDOL (ISOVUE-370) INJECTION 76%
INTRAVENOUS | Status: AC
  Administered 2018-04-24: 100 mL
  Filled 2018-04-24: qty 100

## 2018-04-24 MED ORDER — VANCOMYCIN HCL 10 G IV SOLR
2000.0000 mg | Freq: Once | INTRAVENOUS | Status: AC
  Administered 2018-04-24: 2000 mg via INTRAVENOUS
  Filled 2018-04-24: qty 2000

## 2018-04-24 MED ORDER — PROMETHAZINE HCL 25 MG/ML IJ SOLN
12.5000 mg | Freq: Four times a day (QID) | INTRAMUSCULAR | Status: DC | PRN
  Administered 2018-04-24 (×2): 12.5 mg via INTRAVENOUS
  Filled 2018-04-24 (×2): qty 1

## 2018-04-24 MED ORDER — MUPIROCIN 2 % EX OINT
1.0000 "application " | TOPICAL_OINTMENT | Freq: Two times a day (BID) | CUTANEOUS | Status: AC
  Administered 2018-04-25 – 2018-04-29 (×10): 1 via NASAL
  Filled 2018-04-24 (×2): qty 22

## 2018-04-24 MED ORDER — VANCOMYCIN HCL 10 G IV SOLR
1500.0000 mg | Freq: Two times a day (BID) | INTRAVENOUS | Status: DC
  Administered 2018-04-25 (×2): 1500 mg via INTRAVENOUS
  Filled 2018-04-24 (×3): qty 1500

## 2018-04-24 MED ORDER — CHLORHEXIDINE GLUCONATE CLOTH 2 % EX PADS
6.0000 | MEDICATED_PAD | Freq: Every day | CUTANEOUS | Status: AC
  Administered 2018-04-25 – 2018-04-28 (×4): 6 via TOPICAL

## 2018-04-24 MED ORDER — FAMOTIDINE IN NACL 20-0.9 MG/50ML-% IV SOLN
20.0000 mg | Freq: Two times a day (BID) | INTRAVENOUS | Status: DC
  Administered 2018-04-25 – 2018-05-01 (×14): 20 mg via INTRAVENOUS
  Filled 2018-04-24 (×14): qty 50

## 2018-04-24 MED ORDER — INSULIN ASPART 100 UNIT/ML ~~LOC~~ SOLN: 1.0000 [IU] | Freq: Four times a day (QID) | SUBCUTANEOUS | Status: DC | PRN

## 2018-04-24 MED ORDER — IPRATROPIUM BROMIDE 0.02 % IN SOLN: 0.5000 mg | RESPIRATORY_TRACT | Status: DC | PRN

## 2018-04-24 MED ORDER — HEPARIN SODIUM (PORCINE) 5000 UNIT/ML IJ SOLN
5000.0000 [IU] | Freq: Three times a day (TID) | INTRAMUSCULAR | Status: DC
  Administered 2018-04-25 – 2018-05-01 (×19): 5000 [IU] via SUBCUTANEOUS
  Filled 2018-04-24 (×19): qty 1

## 2018-04-24 NOTE — Progress Notes (Signed)
Patient complaining of shortness of breath, HR 130's oxygen level 97% on partial non re breather 15 liters. Respiration 39. Patient extremely anxious encouraged slow breathing technique. Notified Dr. Ree Kida new ordered received. Significant other at beside and updated on plan of care.

## 2018-04-24 NOTE — Progress Notes (Addendum)
Progress note: Patient seen at bedside due to increasing O2 requirements, worsening tachypnea.  CT scan shows B infiltrates especially at bases but throughout lungs.  Patient with elevated respiratory rate, but does seem to be doing OKAY on the 15L NRB for the moment, not requiring immediate intubation yet at this point.  I am concerned for Inhalation associated ARDS in this patient given his HPI, CT findings, and course since admission: 1. ABG ordered 2. Given worsening respiratory failure in setting of fever and PNA, will go ahead and broaden ABx coverage to empiric zosyn / vanc / azithro for now.  Still spiking fever of 102.9 despite this being 3rd day on ABx now. 3. Lactate ordered 4. Repeat CBC/BMP ordered 5. BNP ordered - will back fluids down to 100cc/hr, but no cardiac history, no peripheral edema, radiologist felt pulm edema less likely on CT. 6. Patient transferred to SDU 7. Im giving a call to PCCM for consult. 8. Got solumedrol dose at 1600, currently ordered at 60mg  IV daily. 9. Will order repeat BCx 10. Will order sputum Cx 11. Will order RVP 12. Will order legionella urine AG  CRITICAL CARE Performed by: Etta Quill.   Total critical care time: 70 minutes  Critical care time was exclusive of separately billable procedures and treating other patients.  Critical care was necessary to treat or prevent imminent or life-threatening deterioration.  Critical care was time spent personally by me on the following activities: development of treatment plan with patient and/or surrogate as well as nursing, discussions with consultants, evaluation of patient's response to treatment, examination of patient, obtaining history from patient or surrogate, ordering and performing treatments and interventions, ordering and review of laboratory studies, ordering and review of radiographic studies, pulse oximetry and re-evaluation of patient's condition.  Update: Spoke with Dr. Oletta Darter,  he recs BIPAP for the moment to see if this will help with O2 and RR.  Says PCCM will consult on patient.  Also note, incorrect note type in system, this should be progress note, not H+P.

## 2018-04-24 NOTE — Progress Notes (Addendum)
PROGRESS NOTE    [name]  ZES:[id number] DOB: [birth year] DOA: 04/22/2018 PCP: Haywood Pao, MD   Brief Narrative:  HPI On 04/22/2018 by Dr. Roxan Hockey Eric [name]  is a 32 y.o. male with past medical history relevant for bipolar disorder, mutant asthma who presents with 2 to 3 days of cough, fevers, shortness of breath fatigue and malaise, No sick contacts, no recent travels, patient has nausea but no vomiting Additional history obtained from patient's mother at bedside Patient has no prior history of hospitalizations as an adult, no prior history of ED visits for asthma no prior intubations or ICU admissions He was in his usual state of health until 2 to 3 days ago when he started to have respiratory symptoms  Assessment & Plan   Addendum 6:43pm: CTA chest obtained, negative for PE. Shows B/L pneumonia. Discussed findings with Dr. Vaughan Browner, PCCM, recommended continuing same antibiotics and if no improvement, place formal consult. Started patient on Afrin, solumedrol (only 60mg  daily as to not worsen anxiety), placed on atrovent nebs.   Sepsis secondary to pneumonia -Patient presented with fever, tachycardia, tachypnea with leukocytosis -Chest x-ray shows patchy bibasilar infiltrate, slightly more on the right -Continue azithromycin, ceftriaxone -Blood culture shows no growth to date -Patient continues to have congestion and cough -Will order flutter valve, incentive spirometry -Continue antitussives nightly (Tussionex added) -Continue Flonase for nasal congestion -Continue nebulizer treatments -Continues to have tachycardia  -will obtain CTA chest and maxillofacial  -pending results of CTA chest, may benefit from steroids  Acute hypoxic respiratory failure secondary to pneumonia -Upon admission, patient was found to have hypoxia with a oxygen saturation of 86% on room air and at rest -Continue supplemental oxygen as well as treatment listed above -SpO2 earlier today 92%  on room air  Bipolar disorder/depression/anxiety -Continue Lamictal, Zoloft, Xanax -increased xanax to TID PRN  THC abuse -Admits to using THC vaping -Counseled on cessation -Drug screen +THC  Headache  -Toradol, Fioricet as well as Phenergan as needed -possibly be worsened by coughing, nasal congestion -As above will obtain CT  DVT Prophylaxis  heparin  Code Status: Full  Family Communication: Wife at bedside  Disposition Plan: Admitted. Continues to be inpatient given the use of IV antibiotics, poor intake, hypoxia  Consultants None  Procedures  None  Antibiotics   Anti-infectives (From admission, onward)   Start     Dose/Rate Route Frequency Ordered Stop   04/24/18 1400  azithromycin (ZITHROMAX) tablet 500 mg     500 mg Oral Daily 04/24/18 1213     04/23/18 1400  azithromycin (ZITHROMAX) 500 mg in sodium chloride 0.9 % 250 mL IVPB  Status:  Discontinued     500 mg 250 mL/hr over 60 Minutes Intravenous Every 24 hours 04/22/18 1531 04/24/18 1213   04/22/18 1545  cefTRIAXone (ROCEPHIN) 1 g in sodium chloride 0.9 % 100 mL IVPB     1 g 200 mL/hr over 30 Minutes Intravenous Every 24 hours 04/22/18 1531     04/22/18 1400  levofloxacin (LEVAQUIN) IVPB 500 mg     500 mg 100 mL/hr over 60 Minutes Intravenous  Once 04/22/18 1347 04/22/18 1550      Subjective:   Eric [name] seen and examined today.  Please complain of headache, pain in his sinuses and teeth, shortness of breath, cough.  Denies current chest pain, dizziness, abdominal pain, diarrhea constipation.  Has had some nausea and vomiting.  Objective:   Vitals:   04/24/18 0200 04/24/18 0300 04/24/18  8315 04/24/18 0828  BP:   134/86   Pulse: (!) 124 (!) 115 (!) 130   Resp:   18   Temp:   100 F (37.8 C)   TempSrc:   Oral   SpO2:   92% 93%  Weight:      Height:        Intake/Output Summary (Last 24 hours) at 04/24/2018 1237 Last data filed at 04/24/2018 0738 Gross per 24 hour  Intake 3429.2 ml  Output 2250  ml  Net 1179.2 ml   Filed Weights   04/22/18 1013  Weight: 81.6 kg   Exam  General: Well developed, well nourished, NAD, appears stated age  HEENT: NCAT, mucous membranes moist.   Neck: Supple  Cardiovascular: S1 S2 auscultated, no rubs, murmurs or gallops. Tachycardia   Respiratory: Diminished breath sounds   Abdomen: Soft, nontender, nondistended, + bowel sounds  Extremities: warm dry without cyanosis clubbing or edema  Neuro: AAOx3, nonfocal  Psych:Anxious  Data Reviewed: I have personally reviewed following labs and imaging studies  CBC: Recent Labs  Lab 04/22/18 1046 04/23/18 0529 04/24/18 0522  WBC 16.4* 11.8* 12.1*  NEUTROABS 14.7*  --   --   HGB 15.4 12.9* 12.5*  HCT 42.6 37.2* 36.4*  MCV 87.3 88.4 88.1  PLT 320 279 176   Basic Metabolic Panel: Recent Labs  Lab 04/22/18 1046 04/23/18 0529 04/24/18 0522  NA 141 138 140  K 3.7 4.0 4.3  CL 101 103 105  CO2 23 25 24   GLUCOSE 105* 108* 108*  BUN 15 10 11   CREATININE 0.79 0.77 0.78  CALCIUM 10.0 8.9 9.1   GFR: Estimated Creatinine Clearance: 138.6 mL/min (by C-G formula based on SCr of 0.78 mg/dL). Liver Function Tests: Recent Labs  Lab 04/22/18 1046  AST 22  ALT 16  ALKPHOS 107  BILITOT 2.0*  PROT 8.9*  ALBUMIN 4.2   No results for input(s): LIPASE, AMYLASE in the last 168 hours. No results for input(s): AMMONIA in the last 168 hours. Coagulation Profile: No results for input(s): INR, PROTIME in the last 168 hours. Cardiac Enzymes: No results for input(s): CKTOTAL, CKMB, CKMBINDEX, TROPONINI in the last 168 hours. BNP (last 3 results) No results for input(s): PROBNP in the last 8760 hours. HbA1C: No results for input(s): HGBA1C in the last 72 hours. CBG: No results for input(s): GLUCAP in the last 168 hours. Lipid Profile: No results for input(s): CHOL, HDL, LDLCALC, TRIG, CHOLHDL, LDLDIRECT in the last 72 hours. Thyroid Function Tests: No results for input(s): TSH, T4TOTAL,  FREET4, T3FREE, THYROIDAB in the last 72 hours. Anemia Panel: No results for input(s): VITAMINB12, FOLATE, FERRITIN, TIBC, IRON, RETICCTPCT in the last 72 hours. Urine analysis: No results found for: COLORURINE, APPEARANCEUR, [place], [place], GLUCOSEU, HGBUR, BILIRUBINUR, KETONESUR, PROTEINUR, UROBILINOGEN, NITRITE, LEUKOCYTESUR Sepsis Labs: @LABRCNTIP (procalcitonin:4,lacticidven:4)  ) Recent Results (from the past 240 hour(s))  Culture, blood (Routine X 2) w Reflex to ID Panel     Status: None (Preliminary result)   Collection Time: 04/22/18  4:24 PM  Result Value Ref Range Status   Specimen Description   Final    RIGHT ANTECUBITAL Performed at [facility] [address], [place], [place] [address]    Special Requests   Final    BOTTLES DRAWN AEROBIC AND ANAEROBIC Blood Culture adequate volume Performed at [facility] [address], [place], [place] [address]    Culture   Final    NO GROWTH 2 DAYS Performed at  [facility], [place] [address], [place], [place] [address]    Report Status PENDING  Incomplete      Radiology Studies: Dg Chest 2 View  Result Date: 04/22/2018 CLINICAL DATA:  Shortness of breath EXAM: CHEST - 2 VIEW COMPARISON:  None. FINDINGS: There is patchy airspace consolidation in the lower lobes bilaterally, slightly more on the right than on the left. Lungs elsewhere are clear. The heart size and pulmonary vascular normal. No adenopathy. No bone lesions. IMPRESSION: Patchy bibasilar infiltrate, slightly more on the right than on the left, felt to represent pneumonia. Lungs elsewhere clear. No adenopathy evident. Electronically Signed   By: Lowella Grip III M.D.   On: 04/22/2018 13:50     Scheduled Meds: . azithromycin  500 mg Oral Daily  . feeding supplement (ENSURE ENLIVE)  237 mL Oral BID BM  . fluticasone  1 spray Each Nare BID  . guaiFENesin  600 mg Oral TID  . heparin  5,000 Units Subcutaneous Q8H  .  ipratropium-albuterol  3 mL Nebulization QID  . lamoTRIgine  100 mg Oral QHS  . sertraline  100 mg Oral QHS  . sodium chloride flush  3 mL Intravenous Q12H   Continuous Infusions: . sodium chloride    . sodium chloride 150 mL/hr at 04/24/18 0841  . cefTRIAXone (ROCEPHIN)  IV Stopped (04/23/18 1547)     LOS: 2 days   Time Spent in minutes   45 minutes (greater than 50% of time spent with patient and wife face to face, answering all questions,  as well as reviewing records, and formulating a plan)  Cristal Ford D.O. on 04/24/2018 at 12:37 PM  Between 7am to 7pm - Please see pager noted on [email or web address]  After 7pm go to [email or web address]  And look for the night coverage person covering for me after hours  [facility] Office  [phone]

## 2018-04-24 NOTE — Progress Notes (Signed)
RN was notified by NT that patient's oral temp was 102.2 and axillary temp was 99, since there were differences in the two results RN got a rectal temp which showed 102.9. Patient's HR was sustaining in the 140's (which is where he has been the majority of the day, MD Mikhail aware). Patient has been a partial non-re breather mask at 15 L all day. (MD aware) When checking patient's oxygen saturation on room air after taking mask off for a few minutes patient's oxygen saturation dropped  to 69% rapidly. RN reapplied non- re breather mask at 15 L and patient's oxygen came back up 96%. Patient's blood pressure remained stable during this time at 135/88. RN paged MD Ree Kida and got a one time order for Ibuprofen and a one time order of Metoprolol IV along with a transfer order for step-down. RN also applied ice bags under patient's arm pits and legs bilaterally to try and help with decreasing the fever. MD Alcario Drought came to room around 1905 to assess patient and help assist RN along with the night coverage AC to get patient successfully and safely down to step down.  Night shift nurse was given report on patient's status during the day and events leading up to transfer. Patient was successfully transferred into room 1232 from room 1407.  Carmela Hurt, RN

## 2018-04-24 NOTE — Progress Notes (Signed)
Patient had tachycardia throughout the night.  MD was notified twice of HR in the 130's.  Patient was given metoprolol 5 mg both times, and heart rate briefly dropped to 110-115 bpm.  Patient continues to have pain and nausea/vomiting.  His other vitals are WNL. Will continue to monitor. Eric [name]

## 2018-04-24 NOTE — Consult Note (Addendum)
PULMONARY / CRITICAL CARE MEDICINE   Name: EVANN ERAZO MRN: [id number] DOB: [birth year]    ADMISSION DATE:  04/22/2018 CONSULTATION DATE: 04/24/18  REFERRING MD: Dr Alcario Drought  CHIEF COMPLAINT: Respiratory distress  HISTORY OF PRESENT ILLNESS:   31yoM with hx Asthma, Vaping, Bipolar disorder, Anxiety, and Depression, who presented to the hospital on 9/6 c/o cough, fever, HA, SOB x 2-3 days. He denies recent admissions, travel, or sick contacts. On admission, he was found to have bilateral pulmonary infiltrates thought most likely to represent pneumonia. He was started on antibiotics for community acquired pneumonia, but has continued to have tachypnea, tachycardia, and intermittent fevers (up to 102.9). He has had worsening hypoxia as well, initially requiring 3L O2, then up to 15L NRB on 9/7. Now patient with RR reportedly in 30-40's on NRB and was transitioned to BIPAP for work of breathing. PCCM then consulted. At the time of my exam patient is already on BIPAP. He says he is still SOB but that its much much better (gives 2 thumbs up when answering this question). Reported productive cough, fever, HA, but no vomiting, wheezing, or CP. His wife is present during the exam and discussed diagnosis and workup with her as well.   PAST MEDICAL HISTORY :  He  has a past medical history of Anxiety, Bipolar disorder (Adrian), Depression, Headache(784.0), OCD (obsessive compulsive disorder), Parotid mass, and Sebaceous cyst.  PAST SURGICAL HISTORY: He  has a past surgical history that includes Excision Benign Cyst; Wisdom tooth extraction (Right); and Foreign body removal abdominal (Left, 03/12/2016).  No Known Allergies  No current facility-administered medications on file prior to encounter.    Current Outpatient Medications on File Prior to Encounter  Medication Sig  . ALPRAZolam (XANAX) 0.25 MG tablet Take 0.25 mg by mouth daily as needed for anxiety.   . cyclobenzaprine (FLEXERIL) 10 MG tablet Take 1  tablet (10 mg total) by mouth 2 (two) times daily as needed for muscle spasms. (Patient taking differently: Take 10-20 mg by mouth 2 (two) times daily as needed for muscle spasms. )  . lamoTRIgine (LAMICTAL) 100 MG tablet Take 100 mg by mouth at bedtime.   . metaxalone (SKELAXIN) 800 MG tablet Take 400-800 mg by mouth 3 (three) times daily as needed for muscle spasms.   Marland Kitchen sertraline (ZOLOFT) 100 MG tablet Take 100 mg by mouth at bedtime.  . ARNUITY ELLIPTA 100 MCG/ACT AEPB INHALE 1 PUFF INTO THE LUNGS DAILY. (Patient not taking: No sig reported)  . citalopram (CELEXA) 20 MG tablet Take 1 tablet (20 mg total) by mouth daily.  . fluticasone (FLONASE) 50 MCG/ACT nasal spray Use 2 sprays per nostril once daily as needed (Patient not taking: Reported on 04/22/2018)  . HYDROcodone-acetaminophen (NORCO) 5-325 MG tablet Take 1-2 tablets by mouth every 4 (four) hours as needed for moderate pain. (Patient not taking: Reported on 04/22/2018)  . levocetirizine (XYZAL) 5 MG tablet TAKE 1 TABLET (5 MG TOTAL) BY MOUTH DAILY. (Patient not taking: No sig reported)  . ondansetron (ZOFRAN) 4 MG tablet Take 1 tablet (4 mg total) by mouth every 8 (eight) hours as needed for nausea or vomiting. (Patient not taking: Reported on 04/22/2018)  . triazolam (HALCION) 0.25 MG tablet Take 1 tablet (0.25 mg total) by mouth at bedtime as needed. (Patient taking differently: Take 0.25 mg by mouth at bedtime as needed for sleep. )   FAMILY HISTORY:  His family history is not on file.  SOCIAL HISTORY: He  reports that he  quit smoking about 13 years ago. His smoking use included cigarettes. He has never used smokeless tobacco. He reports that he drinks alcohol. He reports that he does not use drugs.  REVIEW OF SYSTEMS:   Review of Systems  Constitutional: Positive for fever and malaise/fatigue.  HENT: Negative.   Eyes: Negative.   Respiratory: Positive for cough, sputum production and shortness of breath. Negative for wheezing.    Cardiovascular: Negative.   Gastrointestinal: Positive for nausea. Negative for abdominal pain, diarrhea and vomiting.  Genitourinary: Negative.   Musculoskeletal: Negative.   Skin: Negative.   Neurological: Positive for headaches.  Psychiatric/Behavioral: Negative.    SUBJECTIVE:  Lying on ICU bed, on BIPAP  VITAL SIGNS: BP 126/78   Pulse 91   Temp 99.5 F (37.5 C) (Axillary)   Resp (!) 24   Ht 5\' 11"  (1.803 m)   Wt 81.2 kg   SpO2 98%   BMI 24.97 kg/m   BIPAP SETTINGS: Vent Mode: PSV;BIPAP FiO2 (%):  [60 %-75 %] 60 % PEEP:  [8 cmH20] 8 cmH20 Pressure Support:  [5 cmH20] 5 cmH20  INTAKE / OUTPUT: I/O last 3 completed shifts: In: 5544.9 [P.O.:240; I.V.:4804.9; IV Piggyback:500.1] Out: 3050 [Urine:3050]  PHYSICAL EXAMINATION: General: WDWN Young adult male, Lying on ICU bed wearing BIPAP, in NAD Neuro: AAOx3, sleepy but awakens and answers questions, obeying commands, moving all extremities  HEENT: OP clear, MM moist, full face mask BIPAP in place Cardiovascular: Tachycardic with a regular rhythm, no m/r/g Lungs: CTA b/l anteriorly and posteriorly, no accessory muscle use (WHILE on BIPAP).  Abdomen: Soft NTND Musculoskeletal: no LE edema  Skin: no rashes   LABS: BMET Recent Labs  Lab 04/23/18 0529 04/24/18 0522 04/24/18 2044  NA 138 140 139  K 4.0 4.3 4.3  CL 103 105 104  CO2 25 24 23   BUN 10 11 9   CREATININE 0.77 0.78 0.86  GLUCOSE 108* 108* 122*   Electrolytes Recent Labs  Lab 04/23/18 0529 04/24/18 0522 04/24/18 2044  CALCIUM 8.9 9.1 9.4   CBC Recent Labs  Lab 04/23/18 0529 04/24/18 0522 04/24/18 2044  WBC 11.8* 12.1* 12.5*  HGB 12.9* 12.5* 12.7*  HCT 37.2* 36.4* 37.1*  PLT 279 304 359   Coag's No results for input(s): APTT, INR in the last 168 hours.  Sepsis Markers Recent Labs  Lab 04/22/18 1107 04/24/18 2044  LATICACIDVEN 1.27 1.8  PROCALCITON  --  1.69   ABG Recent Labs  Lab 04/24/18 1948  PHART 7.411  PCO2ART 37.5   PO2ART 120*   Liver Enzymes Recent Labs  Lab 04/22/18 1046 04/24/18 2044  AST 22 24  ALT 16 15  ALKPHOS 107 87  BILITOT 2.0* 0.8  ALBUMIN 4.2 2.8*   Cardiac Enzymes No results for input(s): TROPONINI, PROBNP in the last 168 hours.  Glucose No results for input(s): GLUCAP in the last 168 hours.  Imaging Ct Head Wo Contrast  Result Date: 04/24/2018 CLINICAL DATA:  Pain in the jaw and headache EXAM: CT HEAD WITHOUT CONTRAST CT MAXILLOFACIAL WITHOUT CONTRAST TECHNIQUE: Multidetector CT imaging of the head and maxillofacial structures were performed using the standard protocol without intravenous contrast. Multiplanar CT image reconstructions of the maxillofacial structures were also generated. COMPARISON:  Multiple exams, including brain MRI dated 06/14/2015 FINDINGS: CT HEAD FINDINGS Brain: The brainstem, cerebellum, cerebral peduncles, thalami, basal ganglia, basilar cisterns, and ventricular system appear within normal limits. No intracranial hemorrhage, mass lesion, or acute CVA. Vascular: Unremarkable Skull: Unremarkable Other: No supplemental non-categorized findings.  CT MAXILLOFACIAL FINDINGS Osseous: No facial fracture identified. No significant abnormal periapical lucency in the maxilla or mandible. Orbits: Unremarkable Sinuses: Mild chronic frontal, ethmoid, left sphenoid, and bilateral maxillary sinusitis. Soft tissues: Although the patient's metal fillings partially obscure some of the area around the mandible, and we do not have IV contrast to assess for enhancement, I do not demonstrate a definite abscess along the mandible on today's exam to account for the patient's jaw pain. No significant regional adenopathy. No thickening of the epiglottis. No significant mandibular condylar abnormality. Upper cervical spine unremarkable. IMPRESSION: 1. No significant intracranial abnormality. 2. Mild chronic paranasal sinusitis. No other appreciable facial abnormality. No periapical lucency  associated with the teeth. Electronically Signed   By: Van Clines M.D.   On: 04/24/2018 14:02   Ct Angio Chest Pe W Or Wo Contrast  Result Date: 04/24/2018 CLINICAL DATA:  Shortness of breath. Fatigue. Cough and fever. Malaise. Headache. EXAM: CT ANGIOGRAPHY CHEST WITH CONTRAST TECHNIQUE: Multidetector CT imaging of the chest was performed using the standard protocol during bolus administration of intravenous contrast. Multiplanar CT image reconstructions and MIPs were obtained to evaluate the vascular anatomy. CONTRAST:  167mL ISOVUE-370 IOPAMIDOL (ISOVUE-370) INJECTION 76% COMPARISON:  04/22/2018 chest radiograph FINDINGS: Cardiovascular: No large or central pulmonary emboli are identified. The small emboli, particularly at the segmental and subsegmental level, cannot be readily excluded due to the degree of motion artifact. I am not confident that repeating the study, with additional doses of radiation and contrast and associated risk, would provide a different result with regard to sensitivity and negative predictive value. No acute aortic findings.  Heart size within normal limits. Mediastinum/Nodes: Small mediastinal lymph nodes may be reactive but are not pathologically enlarged. Lungs/Pleura: Bilateral patchy ground-glass opacities are present with confluent airspace opacities dependently in both upper lobes and especially in both lower lobes. Air bronchograms noted in the left lower lobe consolidation regions. Upper Abdomen: Unremarkable Musculoskeletal: Unremarkable Review of the MIP images confirms the above findings. IMPRESSION: 1. No filling defect is identified in the pulmonary arterial tree to suggest pulmonary embolus. Sensitivity for segmental and subsegmental emboli is low due to motion artifact. I do not sense that repeating the exam would improve the motion artifact issue. 2. Regions of symmetric consolidation in both lower lobes, with some dependent airspace opacity in the upper lobes  and with considerable bilateral ground-glass opacities scattered in the lungs with some immediate subpleural sparing. Possibilities include multilobar bacterial pneumonia, atypical pneumonia, or aspiration pneumonia. Entities such as pulmonary edema and pulmonary hemorrhage are considered less likely. Electronically Signed   By: Van Clines M.D.   On: 04/24/2018 13:57   Ct Maxillofacial Wo Contrast  Result Date: 04/24/2018 CLINICAL DATA:  Pain in the jaw and headache EXAM: CT HEAD WITHOUT CONTRAST CT MAXILLOFACIAL WITHOUT CONTRAST TECHNIQUE: Multidetector CT imaging of the head and maxillofacial structures were performed using the standard protocol without intravenous contrast. Multiplanar CT image reconstructions of the maxillofacial structures were also generated. COMPARISON:  Multiple exams, including brain MRI dated 06/14/2015 FINDINGS: CT HEAD FINDINGS Brain: The brainstem, cerebellum, cerebral peduncles, thalami, basal ganglia, basilar cisterns, and ventricular system appear within normal limits. No intracranial hemorrhage, mass lesion, or acute CVA. Vascular: Unremarkable Skull: Unremarkable Other: No supplemental non-categorized findings. CT MAXILLOFACIAL FINDINGS Osseous: No facial fracture identified. No significant abnormal periapical lucency in the maxilla or mandible. Orbits: Unremarkable Sinuses: Mild chronic frontal, ethmoid, left sphenoid, and bilateral maxillary sinusitis. Soft tissues: Although the patient's metal fillings partially obscure  some of the area around the mandible, and we do not have IV contrast to assess for enhancement, I do not demonstrate a definite abscess along the mandible on today's exam to account for the patient's jaw pain. No significant regional adenopathy. No thickening of the epiglottis. No significant mandibular condylar abnormality. Upper cervical spine unremarkable. IMPRESSION: 1. No significant intracranial abnormality. 2. Mild chronic paranasal sinusitis.  No other appreciable facial abnormality. No periapical lucency associated with the teeth. Electronically Signed   By: Van Clines M.D.   On: 04/24/2018 14:02   CULTURES: Blood culture (9/6): no growth Sputum culture (9/8): pending Blood culture (9/8): pending  ANTIBIOTICS: Levofloxacin 9/6 Ceftriaxone 9/6>>9/8 Azithromycin 9/7>>9/8 Vancomycin 9/8>> Zosyn 9/8>>  SIGNIFICANT EVENTS: 9/6: presented to ER with cough, sob, fever, HA >> diagnosed with bilateral pneumonia 9/8 PM: worsening tachypnea and hypoxia >> transitioned to BIPAP  LINES/TUBES: PIV  DISCUSSION: 32yoM with hx Asthma, Vaping, Bipolar disorder, Anxiety, and Depression, who presented to the hospital on 9/6 c/o cough, fever, HA, SOB x 2-3 days. He denies recent admissions, travel, or sick contacts. On admission, he was found to have bilateral pulmonary infiltrates thought most likely to represent pneumonia. He was started on antibiotics for community acquired pneumonia, but has continued to have tachypnea, tachycardia, and intermittent fevers (up to 102.9). He has had worsening hypoxia as well, initially requiring 3L O2, then up to 15L NRB on 9/7. Now patient with RR reportedly in 30-40's on NRB and was transitioned to BIPAP for work of breathing.  ASSESSMENT / PLAN:  PULMONARY 1. Acute Hypoxic Respiratory Failure; Pulmonary infiltrates consistent with Pneumonia: - CT Chest on my review shows bilateral pulmonary infiltrates; These are most consistent with pneumonia (bacterial vs viral) but could alternatively represent lipoid pneumonia, rheumatologic associated lung dz, or ARDS from another etiology. - Given the ongoing fever and productive cough, as well as the lack of any history of rheumatologic dz, these pulmonary infiltrates are most likely infectious in nature. Agree with workup that has been done so far (see details below). - Cannot rule out the possibility of Lipoid pneumonia that is showing an association with  vaping. Patient's wife is fairly insistent that we do a test for this. However, outside of bronchoscopy with BAL looking for lipid laden macrophages (requires a special stain), there is no testing for this disease. Also, it is important to note that it is currently only an association and not direct cause/effect has been proven. He is currently not stable enough for bronchoscopy unless he is intubated.  - clinically he does not appear volume overloaded, and BNP is 69. However out of concern that he is developing early ARDS, will obtain a TTE.  - aim for fluid conservative strategy - currently stable and comfortable on BIPAP with no hypoxia or increased work of breathing. RR has improved to low 20's on the BIPAP. ABG on my review shows no hypercapnea. Low threshold to intubate should he deteriorate.       2. Asthma: - takes arnuity PRN at home; says his asthma is fairly well controlled at baseline - start pulmicort nebs BID; duonebs q6hrs; solumedrol 60mg  IV q6hrs  CARDIOVASCULAR 2. Sinus tachycardia: - this is due to his acute illness and active fevers; please do not give him any more metoprolol for this. Treat pain, fever, dehydration, and other underlying problems that are causing it. But no need for metoprolol.   RENAL No active issues   GASTROINTESTINAL No active issues; NPO; GI prophylaxis  HEMATOLOGIC  1. Anemia (mild):  - Hgb 12.7 and stable this admission, but down from baseline of 15-16; continue to monitor.   INFECTIOUS 1. Sepsis: - sepsis likely related to his pneumonia. Blood cultures and sputum culture pending. Urine strep and legionella antigen pending. Check UA. Check procal and lactate.  - broaden antibiotics from ceftriaxone/azithro to vanc/zosyn.  - APAP and cooling blanket PRN for fever  ENDOCRINE No active issues; SSI PRN  NEUROLOGIC No active issues    FAMILY  - Updated patient and his wife at bedside - Inter-disciplinary family meet or Palliative Care  meeting due by:  04/30/18  60 minutes critical care time  Vernie Murders, MD  Pulmonary and [place] Pager: [phone]  04/24/2018, 10:54 PM

## 2018-04-24 NOTE — Progress Notes (Signed)
PHARMACIST - PHYSICIAN COMMUNICATION  CONCERNING: Antibiotic IV to Oral Route Change Policy  RECOMMENDATION: This patient is receiving azithromycin by the intravenous route.  Based on criteria approved by the Pharmacy and Therapeutics Committee, the antibiotic(s) is/are being converted to the equivalent oral dose form(s).   DESCRIPTION: These criteria include:  Patient being treated for a respiratory tract infection, urinary tract infection, cellulitis or clostridium difficile associated diarrhea if on metronidazole  The patient is not neutropenic and does not exhibit a GI malabsorption state  The patient is eating (either orally or via tube) and/or has been taking other orally administered medications for a least 24 hours  The patient is improving clinically and has a Tmax < 100.5  If you have questions about this conversion, please contact the Pharmacy Department  []   [phone] )  [facility] []   [phone] )  [facility] []   [phone] )  [facility] []   [phone] )  [facility] [x]   [phone] )  [place], [place].D [phone] 04/24/2018 12:14 PM

## 2018-04-24 NOTE — Progress Notes (Signed)
Notified on call hospitalist regarding HR in 130's-142 bpm a 3rd time. Per NP,this time we will watch trend, due to the likelihood that infection is driving this increased rate.  Patient's other vitals continue to be stable. Will continue to monitor.Eric [name]

## 2018-04-24 NOTE — Progress Notes (Signed)
Pharmacy Antibiotic Note  Eric [name] is a 32 y.o. male admitted on 04/22/2018 with worsening respiratory failure.  Pharmacy has been consulted for vancomycin and Zosyn dosing.  Pt was previously on ceftriaxone and azithromycin. However due to worsening respiratory issues pt is being transitioned to vancomycin and Zosyn. Pt also remains on azithromycin 500 mg PO daily as well.    Plan:  Zosyn 3.375g IV q8h (4 hour infusion).   Vancomycin 2000 mg IV x1 then 1500 mg IV q12h  Azithromycin 500 mg PO daily ( MD)    Monitor clinical course, renal function, cultures as available    Height: 5\' 11"  (180.3 cm) Weight: 179 lb 0.2 oz (81.2 kg) IBW/kg (Calculated) : 75.3  Temp (24hrs), Avg:100.4 F (38 C), Min:99.1 F (37.3 C), Max:102.9 F (39.4 C)  Recent Labs  Lab 04/22/18 1046 04/22/18 1107 04/23/18 0529 04/24/18 0522  WBC 16.4*  --  11.8* 12.1*  CREATININE 0.79  --  0.77 0.78  LATICACIDVEN  --  1.27  --   --     Estimated Creatinine Clearance: 138.6 mL/min (by C-G formula based on SCr of 0.78 mg/dL).    No Known Allergies  Antimicrobials this admission: 9/6 ceftriaxone >> 9/8 9/6 azithromycin >>  9/8 vancomycin >>  9/8 Zosyn >>   Dose adjustments this admission: ---  Microbiology results: 9/6 BCx: NGTD 9/7 HIV antibody: negative  9/8 Legionella antigen:  9/8 MRSA PCR:   Thank you for allowing pharmacy to be a part of this patient's care.   Royetta Asal, PharmD, BCPS Pager [phone] 04/24/2018 8:05 PM

## 2018-04-25 ENCOUNTER — Inpatient Hospital Stay (HOSPITAL_COMMUNITY): Payer: 59

## 2018-04-25 DIAGNOSIS — J9601 Acute respiratory failure with hypoxia: Secondary | ICD-10-CM

## 2018-04-25 DIAGNOSIS — A419 Sepsis, unspecified organism: Secondary | ICD-10-CM

## 2018-04-25 DIAGNOSIS — F419 Anxiety disorder, unspecified: Secondary | ICD-10-CM

## 2018-04-25 LAB — GLUCOSE, CAPILLARY
GLUCOSE-CAPILLARY: 151 mg/dL — AB (ref 70–99)
Glucose-Capillary: 108 mg/dL — ABNORMAL HIGH (ref 70–99)
Glucose-Capillary: 115 mg/dL — ABNORMAL HIGH (ref 70–99)
Glucose-Capillary: 142 mg/dL — ABNORMAL HIGH (ref 70–99)

## 2018-04-25 LAB — MAGNESIUM: MAGNESIUM: 2.4 mg/dL (ref 1.7–2.4)

## 2018-04-25 LAB — BLOOD GAS, ARTERIAL
Acid-base deficit: 0.5 mmol/L (ref 0.0–2.0)
Bicarbonate: 24 mmol/L (ref 20.0–28.0)
DRAWN BY: 11249
Delivery systems: POSITIVE
FIO2: 40
Mode: POSITIVE
O2 Saturation: 98.8 %
PATIENT TEMPERATURE: 98.6
PEEP/CPAP: 5 cmH2O
PO2 ART: 144 mmHg — AB (ref 83.0–108.0)
Pressure support: 5 cmH2O
pCO2 arterial: 41 mmHg (ref 32.0–48.0)
pH, Arterial: 7.384 (ref 7.350–7.450)

## 2018-04-25 LAB — LACTIC ACID, PLASMA: LACTIC ACID, VENOUS: 1.5 mmol/L (ref 0.5–1.9)

## 2018-04-25 LAB — BASIC METABOLIC PANEL
ANION GAP: 11 (ref 5–15)
BUN: 16 mg/dL (ref 6–20)
CO2: 23 mmol/L (ref 22–32)
Calcium: 9 mg/dL (ref 8.9–10.3)
Chloride: 106 mmol/L (ref 98–111)
Creatinine, Ser: 0.77 mg/dL (ref 0.61–1.24)
Glucose, Bld: 163 mg/dL — ABNORMAL HIGH (ref 70–99)
Potassium: 3.8 mmol/L (ref 3.5–5.1)
SODIUM: 140 mmol/L (ref 135–145)

## 2018-04-25 LAB — CBC
HCT: 34.2 % — ABNORMAL LOW (ref 39.0–52.0)
Hemoglobin: 11.6 g/dL — ABNORMAL LOW (ref 13.0–17.0)
MCH: 30.3 pg (ref 26.0–34.0)
MCHC: 33.9 g/dL (ref 30.0–36.0)
MCV: 89.3 fL (ref 78.0–100.0)
PLATELETS: 329 10*3/uL (ref 150–400)
RBC: 3.83 MIL/uL — AB (ref 4.22–5.81)
RDW: 12.4 % (ref 11.5–15.5)
WBC: 8.3 10*3/uL (ref 4.0–10.5)

## 2018-04-25 LAB — RESPIRATORY PANEL BY PCR
ADENOVIRUS-RVPPCR: NOT DETECTED
BORDETELLA PERTUSSIS-RVPCR: NOT DETECTED
CHLAMYDOPHILA PNEUMONIAE-RVPPCR: NOT DETECTED
CORONAVIRUS NL63-RVPPCR: NOT DETECTED
Coronavirus 229E: NOT DETECTED
Coronavirus HKU1: NOT DETECTED
Coronavirus OC43: NOT DETECTED
INFLUENZA B-RVPPCR: NOT DETECTED
Influenza A: NOT DETECTED
MYCOPLASMA PNEUMONIAE-RVPPCR: NOT DETECTED
Metapneumovirus: NOT DETECTED
PARAINFLUENZA VIRUS 4-RVPPCR: NOT DETECTED
Parainfluenza Virus 1: NOT DETECTED
Parainfluenza Virus 2: NOT DETECTED
Parainfluenza Virus 3: NOT DETECTED
Respiratory Syncytial Virus: NOT DETECTED
Rhinovirus / Enterovirus: NOT DETECTED

## 2018-04-25 LAB — URINALYSIS, ROUTINE W REFLEX MICROSCOPIC
BILIRUBIN URINE: NEGATIVE
Bacteria, UA: NONE SEEN
GLUCOSE, UA: NEGATIVE mg/dL
Ketones, ur: NEGATIVE mg/dL
Leukocytes, UA: NEGATIVE
Nitrite: NEGATIVE
PH: 7 (ref 5.0–8.0)
Protein, ur: NEGATIVE mg/dL
SPECIFIC GRAVITY, URINE: 1.005 (ref 1.005–1.030)

## 2018-04-25 LAB — EXPECTORATED SPUTUM ASSESSMENT W GRAM STAIN, RFLX TO RESP C

## 2018-04-25 LAB — ECHOCARDIOGRAM COMPLETE
HEIGHTINCHES: 71 in
WEIGHTICAEL: 2839.52 [oz_av]

## 2018-04-25 LAB — EXPECTORATED SPUTUM ASSESSMENT W REFEX TO RESP CULTURE: SPECIAL REQUESTS: NORMAL

## 2018-04-25 LAB — SEDIMENTATION RATE: SED RATE: 108 mm/h — AB (ref 0–16)

## 2018-04-25 LAB — PHOSPHORUS: PHOSPHORUS: 2.6 mg/dL (ref 2.5–4.6)

## 2018-04-25 LAB — PROCALCITONIN: Procalcitonin: 1.28 ng/mL

## 2018-04-25 LAB — STREP PNEUMONIAE URINARY ANTIGEN: STREP PNEUMO URINARY ANTIGEN: NEGATIVE

## 2018-04-25 MED ORDER — FLUTICASONE PROPIONATE 50 MCG/ACT NA SUSP
2.0000 | Freq: Every day | NASAL | Status: DC
  Administered 2018-04-25 – 2018-05-01 (×7): 2 via NASAL
  Filled 2018-04-25 (×2): qty 16

## 2018-04-25 MED ORDER — METHYLPREDNISOLONE SODIUM SUCC 125 MG IJ SOLR
125.0000 mg | Freq: Four times a day (QID) | INTRAMUSCULAR | Status: DC
  Administered 2018-04-25 – 2018-04-29 (×16): 125 mg via INTRAVENOUS
  Filled 2018-04-25 (×16): qty 2

## 2018-04-25 MED ORDER — ALPRAZOLAM 0.25 MG PO TABS
0.2500 mg | ORAL_TABLET | Freq: Every day | ORAL | Status: DC
  Administered 2018-04-25 – 2018-04-26 (×2): 0.25 mg via ORAL
  Filled 2018-04-25 (×2): qty 1

## 2018-04-25 MED ORDER — POTASSIUM CHLORIDE CRYS ER 20 MEQ PO TBCR
40.0000 meq | EXTENDED_RELEASE_TABLET | Freq: Once | ORAL | Status: AC
  Administered 2018-04-25: 40 meq via ORAL
  Filled 2018-04-25: qty 2

## 2018-04-25 MED ORDER — ALPRAZOLAM 0.25 MG PO TABS
0.2500 mg | ORAL_TABLET | Freq: Once | ORAL | Status: AC
  Administered 2018-04-25: 0.25 mg via ORAL
  Filled 2018-04-25: qty 1

## 2018-04-25 MED ORDER — IBUPROFEN 200 MG PO TABS
400.0000 mg | ORAL_TABLET | ORAL | Status: DC | PRN
  Administered 2018-04-25 – 2018-04-29 (×7): 400 mg via ORAL
  Filled 2018-04-25 (×7): qty 2

## 2018-04-25 NOTE — Assessment & Plan Note (Addendum)
Autoimmune and vasculitis negative. Features c/w VAPE lung +/- superimposd bacterial process  - Improved after steroids esp on 04/26/2018 after increased steroids onm9/19/19 - some worse mayb e9/11/19 but correlates with not usin bipap and repeat fever/wbc/posslbe cxr worsening - vanc re-added  - improved again clinically 04/28/18 after vanc readded -> PCT normalized. No micro identified yet  Plan - Continue current dose of steroids -> aim for half of solumedrol 04/28/18 and then over next few days get to prednisone 60mg  and from there 3 weeks pred taper - continue vanc and - continue zosyn.  - total 8-10 days -  bipap  - ok for prn in day + but continue QHS for now and gradually taper  - ok to do this in the floor - O2 for piulse ox > 88%

## 2018-04-25 NOTE — Care Management Note (Signed)
Case Management Note  Patient Details  Name: Eric [name] MRN: [id number] Date of Birth: 10-06-1985  Subjective/Objective:                  Signed         Patient admitted for sepsis secondary to pneumonia. Continues to have fever with tachypnea and tachycardia. Has been on antibiotics. Started IV solumedrol on 9/8. CTA chest obtained showing B/L pneumonia.          Action/Plan: Goal Length of Stay: 3 days  Note: Goal Length of Stay assumes optimal recovery, decision making, and care. Patients may be discharged to a lower level of care (either later than or sooner than the goal) when it is appropriate for their clinical status and care needs. Discharge Readiness Return to top of Pneumonia Due to Aspiration RRG - ISC  Discharge readiness is indicated by patient meeting Recovery Milestones, including ALL of the following: ? Hemodynamic stability temp=102.9/hr 150/resp. Rate on bipap=50 ? Oxygen requirement absent or at baseline on bipap ? Aspiration addressed[O] airway patent ? Infection- temp wcb wnlk, ? Ambulatory[P] no bed rest ?  xpected Discharge Date:  (unknown)               Expected Discharge Plan:  Home/Self Care  In-House Referral:     Discharge planning Services  CM Consult  Post Acute Care Choice:    Choice offered to:     DME Arranged:    DME Agency:     HH Arranged:    [place] Agency:     Status of Service:  In process, will continue to follow  If discussed at Long Length of Stay Meetings, dates discussed:    Additional Comments:  Leeroy Cha, RN 04/25/2018, 10:09 AM

## 2018-04-25 NOTE — Assessment & Plan Note (Addendum)
Reported to [place] control9/10/19

## 2018-04-25 NOTE — Progress Notes (Signed)
  Echocardiogram 2D Echocardiogram has been performed.  Merrie Roof F 04/25/2018, 10:09 AM

## 2018-04-25 NOTE — Progress Notes (Signed)
Eric [name]  MRN: [id number]  DOB: [birth year]  DOA: 04/22/2018 Date of Consult: 04/24/18  LOS 3 days  PCP: Haywood Pao, MD   Reason for Consult / Chief Complaint:  Acute resp failure associated with Luiz Iron  Consulting MD:  Dr Alcario Drought of [facility]  HPI/Brief Narrative   19yoM with hx Asthma, Vaping, Bipolar disorder, Anxiety, and Depression, who presented to the hospital on 9/6 c/o cough, fever, HA, SOB x 2-3 days. He denies recent admissions, travel, or sick contacts. On admission, he was found to have bilateral pulmonary infiltrates thought most likely to represent pneumonia. He was started on antibiotics for community acquired pneumonia, but has continued to have tachypnea, tachycardia, and intermittent fevers (up to 102.9). He has had worsening hypoxia as well, initially requiring 3L O2, then up to 15L NRB on 9/7. Now patient with RR reportedly in 30-40's on NRB and was transitioned to BIPAP for work of breathing. PCCM then consulted. At the time of my exam patient is already on BIPAP. He says he is still SOB but that its much much better (gives 2 thumbs up when answering this question). Reported productive cough, fever, HA, but no vomiting, wheezing, or CP. His wife is present during the exam and discussed diagnosis and workup with her as well.    EVENTS   04/22/2018 - admit 9/7 - UDS - positive marjuama HIV negative 04/24/18 - ccm consult  SUBJECTIVE/OVERNIGHT/INTERVAL HX   04/25/2018  -RVP negative, urine strep negative (all yesterday). PCT marginally elevated. UDS positive for marijuana. Wife Roxanne at bedside - says they both smoke MJ. Patient started Vaping street black market MJ some 6-42months ago. Wife says he is 50% better since starting steroids last night. This am off bipap and feeling better but still dyspneic class 4 ECHO - ef 65%  T max 103F  Objective    Vitals:   04/25/18 0900 04/25/18 1000 04/25/18 1100 04/25/18 1200  BP: (!) 130/95 138/77 (!) 152/97 136/89    Pulse: 87 90 (!) 109 (!) 105  Resp: (!) 23 (!) 43 19 (!) 29  Temp:      TempSrc:      SpO2: 99% 94% 91% 92%  Weight:      Height:        Filed Weights   04/22/18 1013 04/24/18 1939 04/25/18 0500  Weight: 81.6 kg 81.2 kg 80.5 kg     Vent Mode: BIPAP FiO2 (%):  [40 %-60 %] 40 % PEEP:  [8 cmH20] 8 cmH20 Pressure Support:  [5 cmH20] 5 cmH20   Physical Exam: General Appearance:  Looks stable when quiet Head:  Normocephalic, without obvious abnormality, atraumatic Eyes:  PERRL - yes, conjunctiva/corneas - clear     Ears:  Normal external ear canals, both ears Nose:  G tube - no but on [place] 4L [place] Throat:  ETT TUBE - no , OG tube - no Neck:  Supple,  No enlargement/tenderness/nodules Lungs: Scattered crackles, RR 30s, Alae nassii flaring+. Not paradoxical Heart:  S1 and S2 normal, no murmur, CVP - no.  Pressors - no Abdomen:  Soft, no masses, no organomegaly Genitalia / Rectal:  Not done Extremities:  Extremities- intact Skin:  ntact in exposed areas . Sacral area - intact Neurologic:  Sedation - none -> RASS - +1 . Moves all 4s - yes. CAM-ICU - neg . Orientation - x3 +    Labs   PULMONARY Recent Labs  Lab 04/24/18 1948 04/25/18 0359  PHART 7.411  7.384  PCO2ART 37.5 41.0  PO2ART 120* 144*  HCO3 23.4 24.0  O2SAT 98.8 98.8    CBC Recent Labs  Lab 04/24/18 0522 04/24/18 2044 04/25/18 0335  HGB 12.5* 12.7* 11.6*  HCT 36.4* 37.1* 34.2*  WBC 12.1* 12.5* 8.3  PLT 304 359 329    COAGULATION No results for input(s): INR in the last 168 hours.  CARDIAC  No results for input(s): TROPONINI in the last 168 hours. No results for input(s): PROBNP in the last 168 hours.   CHEMISTRY Recent Labs  Lab 04/22/18 1046 04/23/18 0529 04/24/18 0522 04/24/18 2044 04/25/18 0335  NA 141 138 140 139 140  K 3.7 4.0 4.3 4.3 3.8  CL 101 103 105 104 106  CO2 23 25 24 23 23   GLUCOSE 105* 108* 108* 122* 163*  BUN 15 10 11 9 16   CREATININE 0.79 0.77 0.78 0.86 0.77  CALCIUM  10.0 8.9 9.1 9.4 9.0  MG  --   --   --   --  2.4  PHOS  --   --   --   --  2.6   Estimated Creatinine Clearance: 138.6 mL/min (by C-G formula based on SCr of 0.77 mg/dL).   LIVER Recent Labs  Lab 04/22/18 1046 04/24/18 2044  AST 22 24  ALT 16 15  ALKPHOS 107 87  BILITOT 2.0* 0.8  PROT 8.9* 7.5  ALBUMIN 4.2 2.8*     INFECTIOUS Recent Labs  Lab 04/22/18 1107 04/24/18 2044 04/24/18 2351 04/25/18 0335  LATICACIDVEN 1.27 1.8 1.5  --   PROCALCITON  --  1.69  --  1.28     ENDOCRINE CBG (last 3)  Recent Labs    04/24/18 2309 04/25/18 0539  GLUCAP 120* 151*         IMAGING x48h  - image(s) personally visualized  -   highlighted in bold Ct Head Wo Contrast  Result Date: 04/24/2018 CLINICAL DATA:  Pain in the jaw and headache EXAM: CT HEAD WITHOUT CONTRAST CT MAXILLOFACIAL WITHOUT CONTRAST TECHNIQUE: Multidetector CT imaging of the head and maxillofacial structures were performed using the standard protocol without intravenous contrast. Multiplanar CT image reconstructions of the maxillofacial structures were also generated. COMPARISON:  Multiple exams, including brain MRI dated 06/14/2015 FINDINGS: CT HEAD FINDINGS Brain: The brainstem, cerebellum, cerebral peduncles, thalami, basal ganglia, basilar cisterns, and ventricular system appear within normal limits. No intracranial hemorrhage, mass lesion, or acute CVA. Vascular: Unremarkable Skull: Unremarkable Other: No supplemental non-categorized findings. CT MAXILLOFACIAL FINDINGS Osseous: No facial fracture identified. No significant abnormal periapical lucency in the maxilla or mandible. Orbits: Unremarkable Sinuses: Mild chronic frontal, ethmoid, left sphenoid, and bilateral maxillary sinusitis. Soft tissues: Although the patient's metal fillings partially obscure some of the area around the mandible, and we do not have IV contrast to assess for enhancement, I do not demonstrate a definite abscess along the mandible on  today's exam to account for the patient's jaw pain. No significant regional adenopathy. No thickening of the epiglottis. No significant mandibular condylar abnormality. Upper cervical spine unremarkable. IMPRESSION: 1. No significant intracranial abnormality. 2. Mild chronic paranasal sinusitis. No other appreciable facial abnormality. No periapical lucency associated with the teeth. Electronically Signed   By: Van Clines M.D.   On: 04/24/2018 14:02   Ct Angio Chest Pe W Or Wo Contrast  Result Date: 04/24/2018 CLINICAL DATA:  Shortness of breath. Fatigue. Cough and fever. Malaise. Headache. EXAM: CT ANGIOGRAPHY CHEST WITH CONTRAST TECHNIQUE: Multidetector CT imaging of the chest was  performed using the standard protocol during bolus administration of intravenous contrast. Multiplanar CT image reconstructions and MIPs were obtained to evaluate the vascular anatomy. CONTRAST:  151mL ISOVUE-370 IOPAMIDOL (ISOVUE-370) INJECTION 76% COMPARISON:  04/22/2018 chest radiograph FINDINGS: Cardiovascular: No large or central pulmonary emboli are identified. The small emboli, particularly at the segmental and subsegmental level, cannot be readily excluded due to the degree of motion artifact. I am not confident that repeating the study, with additional doses of radiation and contrast and associated risk, would provide a different result with regard to sensitivity and negative predictive value. No acute aortic findings.  Heart size within normal limits. Mediastinum/Nodes: Small mediastinal lymph nodes may be reactive but are not pathologically enlarged. Lungs/Pleura: Bilateral patchy ground-glass opacities are present with confluent airspace opacities dependently in both upper lobes and especially in both lower lobes. Air bronchograms noted in the left lower lobe consolidation regions. Upper Abdomen: Unremarkable Musculoskeletal: Unremarkable Review of the MIP images confirms the above findings. IMPRESSION: 1. No  filling defect is identified in the pulmonary arterial tree to suggest pulmonary embolus. Sensitivity for segmental and subsegmental emboli is low due to motion artifact. I do not sense that repeating the exam would improve the motion artifact issue. 2. Regions of symmetric consolidation in both lower lobes, with some dependent airspace opacity in the upper lobes and with considerable bilateral ground-glass opacities scattered in the lungs with some immediate subpleural sparing. Possibilities include multilobar bacterial pneumonia, atypical pneumonia, or aspiration pneumonia. Entities such as pulmonary edema and pulmonary hemorrhage are considered less likely. Electronically Signed   By: Van Clines M.D.   On: 04/24/2018 13:57   Dg Chest Port 1 View  Result Date: 04/25/2018 CLINICAL DATA:  Respiratory failure. EXAM: PORTABLE CHEST 1 VIEW COMPARISON:  Chest CT 04/24/2018 FINDINGS: Cardiomediastinal silhouette is normal. Mediastinal contours appear intact. There is no evidence of pneumothorax. Patchy bilateral airspace consolidation with lower lobe predominance. Diffuse haziness of the interstitial markings. Osseous structures are without acute abnormality. Soft tissues are grossly normal. IMPRESSION: Patchy bilateral airspace consolidation with lower lobe predominance. Probable superimposed interstitial pulmonary edema. Electronically Signed   By: Fidela Salisbury M.D.   On: 04/25/2018 07:22   Ct Maxillofacial Wo Contrast  Result Date: 04/24/2018 CLINICAL DATA:  Pain in the jaw and headache EXAM: CT HEAD WITHOUT CONTRAST CT MAXILLOFACIAL WITHOUT CONTRAST TECHNIQUE: Multidetector CT imaging of the head and maxillofacial structures were performed using the standard protocol without intravenous contrast. Multiplanar CT image reconstructions of the maxillofacial structures were also generated. COMPARISON:  Multiple exams, including brain MRI dated 06/14/2015 FINDINGS: CT HEAD FINDINGS Brain: The  brainstem, cerebellum, cerebral peduncles, thalami, basal ganglia, basilar cisterns, and ventricular system appear within normal limits. No intracranial hemorrhage, mass lesion, or acute CVA. Vascular: Unremarkable Skull: Unremarkable Other: No supplemental non-categorized findings. CT MAXILLOFACIAL FINDINGS Osseous: No facial fracture identified. No significant abnormal periapical lucency in the maxilla or mandible. Orbits: Unremarkable Sinuses: Mild chronic frontal, ethmoid, left sphenoid, and bilateral maxillary sinusitis. Soft tissues: Although the patient's metal fillings partially obscure some of the area around the mandible, and we do not have IV contrast to assess for enhancement, I do not demonstrate a definite abscess along the mandible on today's exam to account for the patient's jaw pain. No significant regional adenopathy. No thickening of the epiglottis. No significant mandibular condylar abnormality. Upper cervical spine unremarkable. IMPRESSION: 1. No significant intracranial abnormality. 2. Mild chronic paranasal sinusitis. No other appreciable facial abnormality. No periapical lucency associated with the teeth.  Electronically Signed   By: Van Clines M.D.   On: 04/24/2018 14:02     ANTIBIOTICS Anti-infectives (From admission, onward)   Start     Dose/Rate Route Frequency Ordered Stop   04/25/18 1000  vancomycin (VANCOCIN) 1,500 mg in sodium chloride 0.9 % 500 mL IVPB     1,500 mg 250 mL/hr over 120 Minutes Intravenous Every 12 hours 04/24/18 2001     04/24/18 2200  piperacillin-tazobactam (ZOSYN) IVPB 3.375 g     3.375 g 12.5 mL/hr over 240 Minutes Intravenous Every 8 hours 04/24/18 2001     04/24/18 2100  vancomycin (VANCOCIN) 2,000 mg in sodium chloride 0.9 % 500 mL IVPB     2,000 mg 250 mL/hr over 120 Minutes Intravenous  Once 04/24/18 2001 04/24/18 2235   04/24/18 1400  azithromycin (ZITHROMAX) tablet 500 mg  Status:  Discontinued     500 mg Oral Daily 04/24/18 1213  04/24/18 2135   04/23/18 1400  azithromycin (ZITHROMAX) 500 mg in sodium chloride 0.9 % 250 mL IVPB  Status:  Discontinued     500 mg 250 mL/hr over 60 Minutes Intravenous Every 24 hours 04/22/18 1531 04/24/18 1213   04/22/18 1545  cefTRIAXone (ROCEPHIN) 1 g in sodium chloride 0.9 % 100 mL IVPB  Status:  Discontinued     1 g 200 mL/hr over 30 Minutes Intravenous Every 24 hours 04/22/18 1531 04/24/18 1934   04/22/18 1400  levofloxacin (LEVAQUIN) IVPB 500 mg     500 mg 100 mL/hr over 60 Minutes Intravenous  Once 04/22/18 1347 04/22/18 1550         Assessment & Plan:  Acute respiratory failure with hypoxia (HCC) Improved after steroids Features c/w VAPE lung +/- superimposd bacterial process  Plan Steroids but increase dose bipap 2h on and 2h off in day + QHS O2 for piulse ox > 88% ABx as below Check autoimmune and vasculitis profile  Marijuana user/Vapes THC Needs to report to poison control  Sepsis ([place]) Sinusitis v pneumonia v both  Plan abx as below Can taper 04/26/18    Anxiety Xanax at night     Best Practice / Goals of Care / Disposition.   DVT prophylaxis: heparin SQ GI prophylaxis: pepcid Diet: clear liquid Mobility:bed rest Code Status: full code Family Communication: wife roxanne and patient updated 04/25/2018   Disposition / Summary of Today's Plan 04/25/2018   SDU. Move to [facility] primary service from 04/26/18 with ccm consult - d.w Dr Jonathon Bellows    ATTESTATION & SIGNATURE    The patient is critically ill with multiple organ systems failure and requires high complexity decision making for assessment and support, frequent evaluation and titration of therapies, application of advanced monitoring technologies and extensive interpretation of multiple databases.   Critical Care Time devoted to patient care services described in this note is  30  Minutes. This time reflects time of care of this signee Dr Brand Males. This critical care time does not  reflect procedure time, or teaching time or supervisory time of PA/NP/Med student/Med Resident etc but could involve care discussion time      Dr. Brand Males, M.D., [facility].C.P Pulmonary and Critical Care Medicine Staff Physician [place] Pager: [phone], If no answer or between  15:00h - 7:00h: call [phone]  04/25/2018 12:14 PM  LOS 3 days

## 2018-04-25 NOTE — Assessment & Plan Note (Addendum)
Sinusitis v pneumonia v both. rpeat fever 04/26/18 with rising wbc 04/27/18  Plan vanc and zosyn per above

## 2018-04-25 NOTE — Progress Notes (Signed)
[facility] Progress Note Patient Name: Eric [name] DOB: 05/14/1986 MRN: [id number]   Date of Service  04/25/2018  HPI/Events of Note  Patient c/o neck pain - Creatinine = 0.77.  eICU Interventions  Will order: 1. Motrin 400 mg PO Q 4 hours PRN pain.      Intervention Category Intermediate Interventions: Pain - evaluation and management  Sommer,Steven Eugene 04/25/2018, 9:54 PM

## 2018-04-25 NOTE — Progress Notes (Signed)
Patient admitted for sepsis secondary to pneumonia. Continues to have fever with tachypnea and tachycardia. Has been on antibiotics. Started IV solumedrol on 9/8. CTA chest obtained showing B/L pneumonia. May need bronchoscopy.  PCCM has assumed care of the patient. TRH will sign off. If we can be of service, please let us know.

## 2018-04-25 NOTE — Assessment & Plan Note (Signed)
Xanax at night

## 2018-04-26 LAB — MPO/PR-3 (ANCA) ANTIBODIES

## 2018-04-26 LAB — RHEUMATOID FACTOR: Rhuematoid fact SerPl-aCnc: 24 IU/mL — ABNORMAL HIGH (ref 0.0–13.9)

## 2018-04-26 LAB — GLUCOSE, CAPILLARY
GLUCOSE-CAPILLARY: 128 mg/dL — AB (ref 70–99)
GLUCOSE-CAPILLARY: 118 mg/dL — AB (ref 70–99)
Glucose-Capillary: 159 mg/dL — ABNORMAL HIGH (ref 70–99)
Glucose-Capillary: 149 mg/dL — ABNORMAL HIGH (ref 70–99)

## 2018-04-26 LAB — ANTINUCLEAR ANTIBODIES, IFA: ANA Ab, IFA: NEGATIVE

## 2018-04-26 LAB — GLOMERULAR BASEMENT MEMBRANE ANTIBODIES: GBM AB: 3 U (ref 0–20)

## 2018-04-26 LAB — CYCLIC CITRUL PEPTIDE ANTIBODY, IGG/IGA: CCP ANTIBODIES IGG/IGA: 3 U (ref 0–19)

## 2018-04-26 LAB — ANTI-SCLERODERMA ANTIBODY: Scleroderma (Scl-70) (ENA) Antibody, IgG: <0.2 AI (ref 0.0–0.9)

## 2018-04-26 LAB — SJOGRENS SYNDROME-B EXTRACTABLE NUCLEAR ANTIBODY: SSB (La) (ENA) Antibody, IgG: <0.2 AI (ref 0.0–0.9)

## 2018-04-26 LAB — PROCALCITONIN: Procalcitonin: 0.82 ng/mL

## 2018-04-26 LAB — SJOGRENS SYNDROME-A EXTRACTABLE NUCLEAR ANTIBODY: SSA (Ro) (ENA) Antibody, IgG: <0.2 AI (ref 0.0–0.9)

## 2018-04-26 MED ORDER — ZOLPIDEM TARTRATE 5 MG PO TABS
5.0000 mg | ORAL_TABLET | Freq: Every evening | ORAL | Status: DC | PRN
  Administered 2018-04-26 – 2018-04-29 (×4): 5 mg via ORAL
  Filled 2018-04-26 (×4): qty 1

## 2018-04-26 MED ORDER — VANCOMYCIN HCL 10 G IV SOLR
1500.0000 mg | Freq: Two times a day (BID) | INTRAVENOUS | Status: DC
  Administered 2018-04-26: 1500 mg via INTRAVENOUS
  Filled 2018-04-26: qty 1500

## 2018-04-26 NOTE — Progress Notes (Signed)
Case reported to Poison Control 3:19 PM 04/26/2018  - by Dr Chase Caller and charge ICU RN - Scharlene Gloss  Vaping hx eliciting   - black market MJ use; [employer] - since Feb 2019 - using 1g every 2-3 days - he and wife together - device capped on both ends but cap not always secure - he is worried that someone might have contaminated this. Patient says he did not alter them in anyway - he does have used cartridges to give to poison control if needed from home     SIGNATURE    Dr. Brand Males, M.D., F.C.C.P,  Pulmonary and Critical Care Medicine Staff Physician, [place] Director - Interstitial Lung Disease  Program  Pulmonary [place] [facility] [place], [place], [address]  Pager: [phone], If no answer or between  15:00h - 7:00h: call [phone] Telephone: [phone]  3:21 PM 04/26/2018

## 2018-04-26 NOTE — Care Management (Signed)
  Discharge Readiness Milestones  MCG Used: pneumonia Optimal GLOS: 3- Hospital Day: 4 Extended Stay Return to top of Pneumonia Due to Aspiration RRG - ISC Minimal (a few hours to 1 day), Brief (1 to 3 days), Moderate (4 to 7 days), and Prolonged (more than 7 days). [Expand All / Collapse All]  Extended stay beyond goal length of stay may be needed for: ? Severe hypoxemia or respiratory failure  Acute respiratory distress syndrome may occur following large-volume aspiration.  Anticipate respiratory monitoring and treatment.   Continues on BiPap  Expect brief to moderate stay extension. ? Active comorbidities (eg, heart failure, COPD, renal failure)  Anticipate treatment directed at comorbidity.  Expect brief stay extension.  Identified Barriers: Date/Name case discussed with attending MD (if applicable):  Date/name case discussed with supervisor (if applicable):  Date/name case referred to physician advisor (if applicable)   Concurrent Review - Discharge Readiness Milestones, Not Met

## 2018-04-26 NOTE — Progress Notes (Signed)
PROGRESS NOTE    Eric [name]  WVP:[id number] DOB: 04-21-1986 DOA: 04/22/2018 PCP: Haywood Pao, MD   Brief Narrative:  HPI On 04/22/2018 by Dr. Roxan Hockey Eric [name]  is a 32 y.o. male with past medical history relevant for bipolar disorder, mutant asthma who presents with 2 to 3 days of cough, fevers, shortness of breath fatigue and malaise, No sick contacts, no recent travels, patient has nausea but no vomiting Additional history obtained from patient's mother at bedside Patient has no prior history of hospitalizations as an adult, no prior history of ED visits for asthma no prior intubations or ICU admissions He was in his usual state of health until 2 to 3 days ago when he started to have respiratory symptoms  Interim history Admitted for sepsis secondary to pneumonia.  Patient became hypoxic with respiratory distress, was transferred to stepdown and placed on BiPAP.  Patient was she started on azithromycin and ceftriaxone however antibiotics were broadened.  Was started on IV Solu-Medrol.  PCCM consulted.  Assessment & Plan   Sepsis secondary to pneumonia -Patient presented with fever, tachycardia, tachypnea with leukocytosis -Chest x-ray shows patchy bibasilar infiltrate, slightly more on the right -Initially placed on azithromycin, ceftriaxone, however transition to vancomycin, Zosyn -Blood culture shows no growth to date -Will order flutter valve, incentive spirometry -CTA chest showed no filling defect.  Regions of symmetric consolidation in both lower lobes, dependent airspace opacity in the upper lobes with considerable bilateral groundglass opacities.  Possibilities include multilobular bacterial pneumonia, atypical, aspiration pneumonia. -PCCM consulted and appreciated -Strep pneumonia urine antigen negative -Fever as well as tachycardia have improved, patient continues to be tachypneic -CT maxillofacial: mild chronic paranasal sinusitis.  Acute hypoxic  respiratory failure secondary to pneumonia -Upon admission, patient was found to have hypoxia with a oxygen saturation of 86% on room air and at rest -Echocardiogram shows an EF of 60 to 65%, no regional wall motion abnormalities -Patient continued to have respiratory failure and was transferred to stepdown for further monitoring.  Was placed on BiPAP. -PCCM consulted and appreciated -Continue Pulmicort, nebulizer treatments as well as IV steroids and antibiotics as above -Pending sputum culture -Respiratory viral panel negative -RF, Sjogren A/B, Anti-scl Ab, glomerular basement negative -Pending further autoimmune workup  Bipolar disorder/depression/anxiety -Continue Xanax -Lamictal, Celexa, Zoloft held  THC abuse -Admits to using THC vaping -Counseled on cessation -Drug screen +THC  Headache  -Toradol, Fioricet as well as Phenergan as needed -possibly be worsened by coughing, nasal congestion -CT head no significant or cranial abnormality  DVT Prophylaxis  heparin  Code Status: Full  Family Communication: Brother at bedside  Disposition Plan: Admitted. Continues to be inpatient given the use of IV antibiotics, poor intake, hypoxia. Will continue to monitor in stepdown.   Consultants PCCM  Procedures  Echocardiogram  Antibiotics   Anti-infectives (From admission, onward)   Start     Dose/Rate Route Frequency Ordered Stop   04/25/18 1000  vancomycin (VANCOCIN) 1,500 mg in sodium chloride 0.9 % 500 mL IVPB     1,500 mg 250 mL/hr over 120 Minutes Intravenous Every 12 hours 04/24/18 2001     04/24/18 2200  piperacillin-tazobactam (ZOSYN) IVPB 3.375 g     3.375 g 12.5 mL/hr over 240 Minutes Intravenous Every 8 hours 04/24/18 2001     04/24/18 2100  vancomycin (VANCOCIN) 2,000 mg in sodium chloride 0.9 % 500 mL IVPB     2,000 mg 250 mL/hr over 120 Minutes Intravenous  Once 04/24/18 2001  04/24/18 2235   04/24/18 1400  azithromycin (ZITHROMAX) tablet 500 mg  Status:   Discontinued     500 mg Oral Daily 04/24/18 1213 04/24/18 2135   04/23/18 1400  azithromycin (ZITHROMAX) 500 mg in sodium chloride 0.9 % 250 mL IVPB  Status:  Discontinued     500 mg 250 mL/hr over 60 Minutes Intravenous Every 24 hours 04/22/18 1531 04/24/18 1213   04/22/18 1545  cefTRIAXone (ROCEPHIN) 1 g in sodium chloride 0.9 % 100 mL IVPB  Status:  Discontinued     1 g 200 mL/hr over 30 Minutes Intravenous Every 24 hours 04/22/18 1531 04/24/18 1934   04/22/18 1400  levofloxacin (LEVAQUIN) IVPB 500 mg     500 mg 100 mL/hr over 60 Minutes Intravenous  Once 04/22/18 1347 04/22/18 1550      Subjective:   Eric [name] seen and examined today.  Currently on BiPAP. Feels his breathing has improved. Denies current, abdominal pain, nausea or vomiting, diarrhea constipation.  Feels headache is also improved some.  Has been able to sleep more.  Objective:   Vitals:   04/26/18 0551 04/26/18 0600 04/26/18 0800 04/26/18 0926  BP:  135/84    Pulse:  69    Resp:  (!) 25    Temp: 98.1 F (36.7 C)  97.9 F (36.6 C)   TempSrc: Axillary  Axillary   SpO2:  98%  96%  Weight:      Height:        Intake/Output Summary (Last 24 hours) at 04/26/2018 0959 Last data filed at 04/26/2018 0840 Gross per 24 hour  Intake 4714.04 ml  Output -  Net 4714.04 ml   Filed Weights   04/22/18 1013 04/24/18 1939 04/25/18 0500  Weight: 81.6 kg 81.2 kg 80.5 kg   Exam  General: Well developed, well nourished, NAD, appears stated age  18: NCAT, mucous membranes moist. Currently on BiPAP  Cardiovascular: S1 S2 auscultated, no rubs, murmurs or gallops. Regular rate and rhythm.  Respiratory: Diminished breath sounds, scattered crackles, tachypneic  Abdomen: Soft, nontender, nondistended, + bowel sounds  Extremities: warm dry without cyanosis clubbing or edema  Neuro: AAOx3, nonfocal  Psych: Normal affect and demeanor  Data Reviewed: I have personally reviewed following labs and imaging  studies  CBC: Recent Labs  Lab 04/22/18 1046 04/23/18 0529 04/24/18 0522 04/24/18 2044 04/25/18 0335  WBC 16.4* 11.8* 12.1* 12.5* 8.3  NEUTROABS 14.7*  --   --   --   --   HGB 15.4 12.9* 12.5* 12.7* 11.6*  HCT 42.6 37.2* 36.4* 37.1* 34.2*  MCV 87.3 88.4 88.1 89.4 89.3  PLT 320 279 304 359 629   Basic Metabolic Panel: Recent Labs  Lab 04/22/18 1046 04/23/18 0529 04/24/18 0522 04/24/18 2044 04/25/18 0335  NA 141 138 140 139 140  K 3.7 4.0 4.3 4.3 3.8  CL 101 103 105 104 106  CO2 23 25 24 23 23   GLUCOSE 105* 108* 108* 122* 163*  BUN 15 10 11 9 16   CREATININE 0.79 0.77 0.78 0.86 0.77  CALCIUM 10.0 8.9 9.1 9.4 9.0  MG  --   --   --   --  2.4  PHOS  --   --   --   --  2.6   GFR: Estimated Creatinine Clearance: 138.6 mL/min (by C-G formula based on SCr of 0.77 mg/dL). Liver Function Tests: Recent Labs  Lab 04/22/18 1046 04/24/18 2044  AST 22 24  ALT 16 15  ALKPHOS 107 87  BILITOT 2.0* 0.8  PROT 8.9* 7.5  ALBUMIN 4.2 2.8*   No results for input(s): LIPASE, AMYLASE in the last 168 hours. No results for input(s): AMMONIA in the last 168 hours. Coagulation Profile: No results for input(s): INR, PROTIME in the last 168 hours. Cardiac Enzymes: No results for input(s): CKTOTAL, CKMB, CKMBINDEX, TROPONINI in the last 168 hours. BNP (last 3 results) No results for input(s): PROBNP in the last 8760 hours. HbA1C: No results for input(s): HGBA1C in the last 72 hours. CBG: Recent Labs  Lab 04/25/18 0539 04/25/18 1323 04/25/18 1912 04/25/18 2328 04/26/18 0546  GLUCAP 151* 108* 115* 142* 128*   Lipid Profile: No results for input(s): CHOL, HDL, LDLCALC, TRIG, CHOLHDL, LDLDIRECT in the last 72 hours. Thyroid Function Tests: No results for input(s): TSH, T4TOTAL, FREET4, T3FREE, THYROIDAB in the last 72 hours. Anemia Panel: No results for input(s): VITAMINB12, FOLATE, FERRITIN, TIBC, IRON, RETICCTPCT in the last 72 hours. Urine analysis:    Component Value  Date/Time   COLORURINE STRAW (A) 04/24/2018 2332   APPEARANCEUR CLEAR 04/24/2018 2332   LABSPEC 1.005 04/24/2018 2332   PHURINE 7.0 04/24/2018 2332   GLUCOSEU NEGATIVE 04/24/2018 2332   HGBUR SMALL (A) 04/24/2018 2332   BILIRUBINUR NEGATIVE 04/24/2018 2332   KETONESUR NEGATIVE 04/24/2018 2332   PROTEINUR NEGATIVE 04/24/2018 2332   NITRITE NEGATIVE 04/24/2018 2332   LEUKOCYTESUR NEGATIVE 04/24/2018 2332   Sepsis Labs: @LABRCNTIP (procalcitonin:4,lacticidven:4)  ) Recent Results (from the past 240 hour(s))  Culture, blood (Routine X 2) w Reflex to ID Panel     Status: None (Preliminary result)   Collection Time: 04/22/18  4:24 PM  Result Value Ref Range Status   Specimen Description   Final    RIGHT ANTECUBITAL Performed at [facility], [place] [address], [place], [place] [address]    Special Requests   Final    BOTTLES DRAWN AEROBIC AND ANAEROBIC Blood Culture adequate volume Performed at [place] [address], [place], [place] [address]    Culture   Final    NO GROWTH 4 DAYS Performed at [facility], [place] [address], [place], [place] [address]    Report Status PENDING  Incomplete  MRSA PCR Screening     Status: Abnormal   Collection Time: 04/24/18  7:52 PM  Result Value Ref Range Status   MRSA by PCR POSITIVE (A) NEGATIVE Final    Comment:        The GeneXpert MRSA Assay (FDA approved for NASAL specimens only), is one component of a comprehensive MRSA colonization surveillance program. It is not intended to diagnose MRSA infection nor to guide or monitor treatment for MRSA infections. RESULT CALLED TO, READ BACK BY AND VERIFIED WITH: KALLAM AT 2351 ON 04/24/2018 BY MOSLEY,J Performed at [facility], [place] [address], [place], [place] [address]   Culture, sputum-assessment     Status: None   Collection Time: 04/24/18  8:23 PM  Result Value Ref Range Status   Specimen Description EXPECTORATED SPUTUM   Final   Special Requests NONE  Final   Sputum evaluation   Final    Sputum specimen not acceptable for testing.  Please recollect.    NOTIFIED K,WASHIGTON AT 2225 ON 04/24/18 BY A,MOHAMED Performed at [facility], [place] [address] [place], [place] [address]    Report Status 04/24/2018 FINAL  Final  Respiratory Panel by PCR     Status: None   Collection Time: 04/24/18  8:23 PM  Result Value Ref Range Status   Adenovirus NOT DETECTED NOT DETECTED Final   Coronavirus 229E NOT DETECTED NOT DETECTED Final   Coronavirus HKU1 NOT DETECTED NOT DETECTED Final   Coronavirus NL63 NOT DETECTED NOT DETECTED Final   Coronavirus OC43 NOT DETECTED NOT DETECTED Final   Metapneumovirus NOT DETECTED NOT DETECTED Final   Rhinovirus / Enterovirus NOT DETECTED NOT DETECTED Final   Influenza A NOT DETECTED NOT DETECTED Final   Influenza B NOT DETECTED NOT DETECTED Final   Parainfluenza Virus 1 NOT DETECTED NOT DETECTED Final   Parainfluenza Virus 2 NOT DETECTED NOT DETECTED Final   Parainfluenza Virus 3 NOT DETECTED NOT DETECTED Final   Parainfluenza Virus 4 NOT DETECTED NOT DETECTED Final   Respiratory Syncytial Virus NOT DETECTED NOT DETECTED Final   Bordetella pertussis NOT DETECTED NOT DETECTED Final   Chlamydophila pneumoniae NOT DETECTED NOT DETECTED Final   Mycoplasma pneumoniae NOT DETECTED NOT DETECTED Final    Comment: Performed at [facility], [place] [address], [place], [place] [address]  Culture, blood (routine x 2) Call MD if unable to obtain prior to antibiotics being given     Status: None (Preliminary result)   Collection Time: 04/24/18  8:44 PM  Result Value Ref Range Status   Specimen Description BLOOD RIGHT HAND  Final   Special Requests   Final    BOTTLES DRAWN AEROBIC ONLY Blood Culture adequate volume Performed at [place] [address], [place], [place] [address]    Culture   Final    NO GROWTH 1 DAY Performed at [place], [place] [address], [place], [place] [address]    Report Status PENDING  Incomplete  Culture, blood (routine x 2) Call MD if unable to obtain prior to antibiotics being given     Status: None (Preliminary result)   Collection Time: 04/24/18  8:44 PM  Result Value Ref Range Status   Specimen Description   Final    BLOOD RIGHT ANTECUBITAL Performed at [place] [address] [place], [place] [address]    Special Requests   Final    BOTTLES DRAWN AEROBIC ONLY Blood Culture adequate volume Performed at [place] [address] [place], [place] [address]    Culture   Final    NO GROWTH 1 DAY Performed at [facility], [place] [address] [place], [place] [address]    Report Status PENDING  Incomplete  Expectorated sputum assessment w rflx to resp cult     Status: None   Collection Time: 04/25/18 10:55 AM  Result Value Ref Range Status   Specimen Description SPU EXPECTORATED  Final   Special Requests Normal  Final   Sputum evaluation   Final    Sputum specimen not acceptable for testing.  Please recollect.   RESULTS CALLED TO [facility] [id number] @ [place] Performed at [place] [address] [place],  [address]    Report Status 04/25/2018 FINAL  Final      Radiology Studies: Ct Head Wo Contrast  Result Date: 04/24/2018 CLINICAL DATA:  Pain in the jaw and headache EXAM: CT HEAD WITHOUT CONTRAST CT MAXILLOFACIAL WITHOUT CONTRAST TECHNIQUE: Multidetector CT imaging of the head and maxillofacial structures were performed using the standard protocol without intravenous contrast. Multiplanar CT image reconstructions of the maxillofacial structures were also generated. COMPARISON:  Multiple exams, including brain MRI dated 06/14/2015 FINDINGS: CT HEAD FINDINGS Brain:  The brainstem, cerebellum, cerebral peduncles, thalami, basal ganglia, basilar cisterns, and ventricular system appear within normal  limits. No intracranial hemorrhage, mass lesion, or acute CVA. Vascular: Unremarkable Skull: Unremarkable Other: No supplemental non-categorized findings. CT MAXILLOFACIAL FINDINGS Osseous: No facial fracture identified. No significant abnormal periapical lucency in the maxilla or mandible. Orbits: Unremarkable Sinuses: Mild chronic frontal, ethmoid, left sphenoid, and bilateral maxillary sinusitis. Soft tissues: Although the patient's metal fillings partially obscure some of the area around the mandible, and we do not have IV contrast to assess for enhancement, I do not demonstrate a definite abscess along the mandible on today's exam to account for the patient's jaw pain. No significant regional adenopathy. No thickening of the epiglottis. No significant mandibular condylar abnormality. Upper cervical spine unremarkable. IMPRESSION: 1. No significant intracranial abnormality. 2. Mild chronic paranasal sinusitis. No other appreciable facial abnormality. No periapical lucency associated with the teeth. Electronically Signed   By: Van Clines M.D.   On: 04/24/2018 14:02   Ct Angio Chest Pe W Or Wo Contrast  Result Date: 04/24/2018 CLINICAL DATA:  Shortness of breath. Fatigue. Cough and fever. Malaise. Headache. EXAM: CT ANGIOGRAPHY CHEST WITH CONTRAST TECHNIQUE: Multidetector CT imaging of the chest was performed using the standard protocol during bolus administration of intravenous contrast. Multiplanar CT image reconstructions and MIPs were obtained to evaluate the vascular anatomy. CONTRAST:  177mL ISOVUE-370 IOPAMIDOL (ISOVUE-370) INJECTION 76% COMPARISON:  04/22/2018 chest radiograph FINDINGS: Cardiovascular: No large or central pulmonary emboli are identified. The small emboli, particularly at the segmental and subsegmental level, cannot be readily excluded due to the degree of motion artifact. I am not confident that repeating the study, with additional doses of radiation and contrast and associated  risk, would provide a different result with regard to sensitivity and negative predictive value. No acute aortic findings.  Heart size within normal limits. Mediastinum/Nodes: Small mediastinal lymph nodes may be reactive but are not pathologically enlarged. Lungs/Pleura: Bilateral patchy ground-glass opacities are present with confluent airspace opacities dependently in both upper lobes and especially in both lower lobes. Air bronchograms noted in the left lower lobe consolidation regions. Upper Abdomen: Unremarkable Musculoskeletal: Unremarkable Review of the MIP images confirms the above findings. IMPRESSION: 1. No filling defect is identified in the pulmonary arterial tree to suggest pulmonary embolus. Sensitivity for segmental and subsegmental emboli is low due to motion artifact. I do not sense that repeating the exam would improve the motion artifact issue. 2. Regions of symmetric consolidation in both lower lobes, with some dependent airspace opacity in the upper lobes and with considerable bilateral ground-glass opacities scattered in the lungs with some immediate subpleural sparing. Possibilities include multilobar bacterial pneumonia, atypical pneumonia, or aspiration pneumonia. Entities such as pulmonary edema and pulmonary hemorrhage are considered less likely. Electronically Signed   By: Van Clines M.D.   On: 04/24/2018 13:57   Dg Chest Port 1 View  Result Date: 04/25/2018 CLINICAL DATA:  Respiratory failure. EXAM: PORTABLE CHEST 1 VIEW COMPARISON:  Chest CT 04/24/2018 FINDINGS: Cardiomediastinal silhouette is normal. Mediastinal contours appear intact. There is no evidence of pneumothorax. Patchy bilateral airspace consolidation with lower lobe predominance. Diffuse haziness of the interstitial markings. Osseous structures are without acute abnormality. Soft tissues are grossly normal. IMPRESSION: Patchy bilateral airspace consolidation with lower lobe predominance. Probable superimposed  interstitial pulmonary edema. Electronically Signed   By: Fidela Salisbury M.D.   On: 04/25/2018 07:22   Ct Maxillofacial Wo Contrast  Result Date: 04/24/2018 CLINICAL DATA:  Pain in the jaw  and headache EXAM: CT HEAD WITHOUT CONTRAST CT MAXILLOFACIAL WITHOUT CONTRAST TECHNIQUE: Multidetector CT imaging of the head and maxillofacial structures were performed using the standard protocol without intravenous contrast. Multiplanar CT image reconstructions of the maxillofacial structures were also generated. COMPARISON:  Multiple exams, including brain MRI dated 06/14/2015 FINDINGS: CT HEAD FINDINGS Brain: The brainstem, cerebellum, cerebral peduncles, thalami, basal ganglia, basilar cisterns, and ventricular system appear within normal limits. No intracranial hemorrhage, mass lesion, or acute CVA. Vascular: Unremarkable Skull: Unremarkable Other: No supplemental non-categorized findings. CT MAXILLOFACIAL FINDINGS Osseous: No facial fracture identified. No significant abnormal periapical lucency in the maxilla or mandible. Orbits: Unremarkable Sinuses: Mild chronic frontal, ethmoid, left sphenoid, and bilateral maxillary sinusitis. Soft tissues: Although the patient's metal fillings partially obscure some of the area around the mandible, and we do not have IV contrast to assess for enhancement, I do not demonstrate a definite abscess along the mandible on today's exam to account for the patient's jaw pain. No significant regional adenopathy. No thickening of the epiglottis. No significant mandibular condylar abnormality. Upper cervical spine unremarkable. IMPRESSION: 1. No significant intracranial abnormality. 2. Mild chronic paranasal sinusitis. No other appreciable facial abnormality. No periapical lucency associated with the teeth. Electronically Signed   By: Van Clines M.D.   On: 04/24/2018 14:02     Scheduled Meds: . ALPRAZolam  0.25 mg Oral QHS  . budesonide (PULMICORT) nebulizer solution  0.5  mg Nebulization BID  . Chlorhexidine Gluconate Cloth  6 each Topical Q0600  . fluticasone  2 spray Each Nare Daily  . heparin  5,000 Units Subcutaneous Q8H  . ipratropium-albuterol  3 mL Nebulization Q6H  . methylPREDNISolone (SOLU-MEDROL) injection  125 mg Intravenous Q6H  . mupirocin ointment  1 application Nasal BID  . sodium chloride flush  3 mL Intravenous Q12H   Continuous Infusions: . sodium chloride 10 mL (04/24/18 2222)  . sodium chloride 100 mL/hr at 04/26/18 0840  . sodium chloride    . famotidine (PEPCID) IV Stopped (04/25/18 2207)  . piperacillin-tazobactam (ZOSYN)  IV 3.375 g (04/26/18 0631)  . vancomycin Stopped (04/25/18 2339)     LOS: 4 days   Time Spent in minutes   45 minutes (greater than 50% of time spent with patient and wife face to face, answering all questions,  as well as reviewing records, and formulating a plan)  Cristal Ford D.O. on 04/26/2018 at 9:59 AM  Between 7am to 7pm - Please see pager noted on [email or web address]  After 7pm go to [email or web address]  And look for the night coverage person covering for me after hours  [facility] Office  [phone]

## 2018-04-26 NOTE — Progress Notes (Addendum)
Eric [name]  MRN: [id number]  DOB: [birth year]  DOA: 04/22/2018 Date of Consult: 04/24/18  LOS 4 days  PCP: Haywood Pao, MD   Reason for Consult / Chief Complaint:  Acute resp failure associated with Luiz Iron  Consulting MD:  Dr Alcario Drought of [facility]  HPI/Brief Narrative   55yoM with hx Asthma, Vaping, Bipolar disorder, Anxiety, and Depression, who presented to the hospital on 9/6 c/o cough, fever, HA, SOB x 2-3 days. He denies recent admissions, travel, or sick contacts. On admission, he was found to have bilateral pulmonary infiltrates thought most likely to represent pneumonia. He was started on antibiotics for community acquired pneumonia, but has continued to have tachypnea, tachycardia, and intermittent fevers (up to 102.9). He has had worsening hypoxia as well, initially requiring 3L O2, then up to 15L NRB on 9/7. Now patient with RR reportedly in 30-40's on NRB and was transitioned to BIPAP for work of breathing. PCCM then consulted. At the time of my exam patient is already on BIPAP. He says he is still SOB but that its much much better (gives 2 thumbs up when answering this question). Reported productive cough, fever, HA, but no vomiting, wheezing, or CP. His wife is present during the exam and discussed diagnosis and workup with her as well.    EVENTS   04/22/2018 - admit 9/7 - UDS - positive marjuama HIV negative 04/24/18 - ccm consult    04/25/2018  -RVP negative, urine strep negative (all yesterday). PCT marginally elevated. UDS positive for marijuana. Wife Eric [name] at bedside - says they both smoke MJ. Patient started Vaping street black market MJ some 6-3month ago. Wife says he is 50% better since starting steroids last night. This am off bipap and feeling better but still dyspneic class 4 ECHO - ef 65%  T max 103F    SUBJECTIVE/OVERNIGHT/INTERVAL HX    04/26/2018 - afebrile. WBC improving. On 3L [place] and BiPAP QHS with day time on/off. Says he tolerated toileting much  better today. Still feels he wants to do bipap on-off in day. RN thinks he is doing better slowly. ANCA pending but ESR 160. Otehrwise autoimmune negative but for trace positive RF    Objective    Vitals:   04/26/18 0800 04/26/18 0926 04/26/18 1100 04/26/18 1238  BP:      Pulse:   97 93  Resp:   (!) 27 (!) 25  Temp: 97.9 F (36.6 C)     TempSrc: Axillary     SpO2:  96% 96% 100%  Weight:   81.3 kg   Height:        Filed Weights   04/24/18 1939 04/25/18 0500 04/26/18 1100  Weight: 81.2 kg 80.5 kg 81.3 kg     Vent Mode: PSV;CPAP FiO2 (%):  [30 %-40 %] 30 % PEEP:  [8 cmH20] 8 cmH20 Pressure Support:  [5 cmH20] 5 cmH20   Physical Exam: General Appearance:  Looks better. O32 on Head:  Normocephalic, without obvious abnormality, atraumatic Eyes:  PERRL - yes, conjunctiva/corneas - clear     Ears:  Normal external ear canals, both ears Nose:  G tube - no bu thas [place] o2 Throat:  ETT TUBE - no , OG tube - no Neck:  Supple,  No enlargement/tenderness/nodules Lungs: crackles base. Tachypnea + but improved Heart:  S1 and S2 normal, no murmur, CVP - no.  Pressors - no Abdomen:  Soft, no masses, no organomegaly Genitalia / Rectal:  Not done Extremities:  Extremities- intact Skin:  ntact in exposed areas . Sacral area - no Neurologic:  Sedation - none -> RASS - +1 . Moves all 4s - yes. CAM-ICU - neg . Orientation - x3+      Labs   PULMONARY Recent Labs  Lab 04/24/18 1948 04/25/18 0359  PHART 7.411 7.384  PCO2ART 37.5 41.0  PO2ART 120* 144*  HCO3 23.4 24.0  O2SAT 98.8 98.8    CBC Recent Labs  Lab 04/24/18 0522 04/24/18 2044 04/25/18 0335  HGB 12.5* 12.7* 11.6*  HCT 36.4* 37.1* 34.2*  WBC 12.1* 12.5* 8.3  PLT 304 359 329    COAGULATION No results for input(s): INR in the last 168 hours.  CARDIAC  No results for input(s): TROPONINI in the last 168 hours. No results for input(s): PROBNP in the last 168 hours.   CHEMISTRY Recent Labs  Lab 04/22/18 1046  04/23/18 0529 04/24/18 0522 04/24/18 2044 04/25/18 0335  NA 141 138 140 139 140  K 3.7 4.0 4.3 4.3 3.8  CL 101 103 105 104 106  CO2 _0 GLUCOSE 105* 108* 108* 122* 163*  BUN _1 CREATININE 0.79 0.77 0.78 0.86 0.77  CALCIUM 10.0 8.9 9.1 9.4 9.0  MG  --   --   --   --  2.4  PHOS  --   --   --   --  2.6   Estimated Creatinine Clearance: 138.6 mL/min (by C-G formula based on SCr of 0.77 mg/dL).   LIVER Recent Labs  Lab 04/22/18 1046 04/24/18 2044  AST 22 24  ALT 16 15  ALKPHOS 107 87  BILITOT 2.0* 0.8  PROT 8.9* 7.5  ALBUMIN 4.2 2.8*     INFECTIOUS Recent Labs  Lab 04/22/18 1107 04/24/18 2044 04/24/18 2351 04/25/18 0335 04/26/18 0311  LATICACIDVEN 1.27 1.8 1.5  --   --   PROCALCITON  --  1.69  --  1.28 0.82     ENDOCRINE CBG (last 3)  Recent Labs    04/25/18 2328 04/26/18 0546 04/26/18 1138  GLUCAP 142* 128* 159*         IMAGING x48h  - image(s) personally visualized  -   highlighted in bold Ct Head Wo Contrast  Result Date: 04/24/2018 CLINICAL DATA:  Pain in the jaw and headache EXAM: CT HEAD WITHOUT CONTRAST CT MAXILLOFACIAL WITHOUT CONTRAST TECHNIQUE: Multidetector CT imaging of the head and maxillofacial structures were performed using the standard protocol without intravenous contrast. Multiplanar CT image reconstructions of the maxillofacial structures were also generated. COMPARISON:  Multiple exams, including brain MRI dated 06/14/2015 FINDINGS: CT HEAD FINDINGS Brain: The brainstem, cerebellum, cerebral peduncles, thalami, basal ganglia, basilar cisterns, and ventricular system appear within normal limits. No intracranial hemorrhage, mass lesion, or acute CVA. Vascular: Unremarkable Skull: Unremarkable Other: No supplemental non-categorized findings. CT MAXILLOFACIAL FINDINGS Osseous: No facial fracture identified. No significant abnormal periapical lucency in the maxilla or mandible. Orbits: Unremarkable Sinuses: Mild chronic  frontal, ethmoid, left sphenoid, and bilateral maxillary sinusitis. Soft tissues: Although the patient's metal fillings partially obscure some of the area around the mandible, and we do not have IV contrast to assess for enhancement, I do not demonstrate a definite abscess along the mandible on today's exam to account for the patient's jaw pain. No significant regional adenopathy. No thickening of the epiglottis. No significant mandibular condylar abnormality. Upper cervical spine unremarkable. IMPRESSION: 1. No significant intracranial abnormality. 2. Mild chronic paranasal sinusitis. No other  appreciable facial abnormality. No periapical lucency associated with the teeth. Electronically Signed   By: Van Clines M.D.   On: 04/24/2018 14:02   Ct Angio Chest Pe W Or Wo Contrast  Result Date: 04/24/2018 CLINICAL DATA:  Shortness of breath. Fatigue. Cough and fever. Malaise. Headache. EXAM: CT ANGIOGRAPHY CHEST WITH CONTRAST TECHNIQUE: Multidetector CT imaging of the chest was performed using the standard protocol during bolus administration of intravenous contrast. Multiplanar CT image reconstructions and MIPs were obtained to evaluate the vascular anatomy. CONTRAST:  166m ISOVUE-370 IOPAMIDOL (ISOVUE-370) INJECTION 76% COMPARISON:  04/22/2018 chest radiograph FINDINGS: Cardiovascular: No large or central pulmonary emboli are identified. The small emboli, particularly at the segmental and subsegmental level, cannot be readily excluded due to the degree of motion artifact. I am not confident that repeating the study, with additional doses of radiation and contrast and associated risk, would provide a different result with regard to sensitivity and negative predictive value. No acute aortic findings.  Heart size within normal limits. Mediastinum/Nodes: Small mediastinal lymph nodes may be reactive but are not pathologically enlarged. Lungs/Pleura: Bilateral patchy ground-glass opacities are present with  confluent airspace opacities dependently in both upper lobes and especially in both lower lobes. Air bronchograms noted in the left lower lobe consolidation regions. Upper Abdomen: Unremarkable Musculoskeletal: Unremarkable Review of the MIP images confirms the above findings. IMPRESSION: 1. No filling defect is identified in the pulmonary arterial tree to suggest pulmonary embolus. Sensitivity for segmental and subsegmental emboli is low due to motion artifact. I do not sense that repeating the exam would improve the motion artifact issue. 2. Regions of symmetric consolidation in both lower lobes, with some dependent airspace opacity in the upper lobes and with considerable bilateral ground-glass opacities scattered in the lungs with some immediate subpleural sparing. Possibilities include multilobar bacterial pneumonia, atypical pneumonia, or aspiration pneumonia. Entities such as pulmonary edema and pulmonary hemorrhage are considered less likely. Electronically Signed   By: WVan ClinesM.D.   On: 04/24/2018 13:57   Dg Chest Port 1 View  Result Date: 04/25/2018 CLINICAL DATA:  Respiratory failure. EXAM: PORTABLE CHEST 1 VIEW COMPARISON:  Chest CT 04/24/2018 FINDINGS: Cardiomediastinal silhouette is normal. Mediastinal contours appear intact. There is no evidence of pneumothorax. Patchy bilateral airspace consolidation with lower lobe predominance. Diffuse haziness of the interstitial markings. Osseous structures are without acute abnormality. Soft tissues are grossly normal. IMPRESSION: Patchy bilateral airspace consolidation with lower lobe predominance. Probable superimposed interstitial pulmonary edema. Electronically Signed   By: DFidela SalisburyM.D.   On: 04/25/2018 07:22   Ct Maxillofacial Wo Contrast  Result Date: 04/24/2018 CLINICAL DATA:  Pain in the jaw and headache EXAM: CT HEAD WITHOUT CONTRAST CT MAXILLOFACIAL WITHOUT CONTRAST TECHNIQUE: Multidetector CT imaging of the head and  maxillofacial structures were performed using the standard protocol without intravenous contrast. Multiplanar CT image reconstructions of the maxillofacial structures were also generated. COMPARISON:  Multiple exams, including brain MRI dated 06/14/2015 FINDINGS: CT HEAD FINDINGS Brain: The brainstem, cerebellum, cerebral peduncles, thalami, basal ganglia, basilar cisterns, and ventricular system appear within normal limits. No intracranial hemorrhage, mass lesion, or acute CVA. Vascular: Unremarkable Skull: Unremarkable Other: No supplemental non-categorized findings. CT MAXILLOFACIAL FINDINGS Osseous: No facial fracture identified. No significant abnormal periapical lucency in the maxilla or mandible. Orbits: Unremarkable Sinuses: Mild chronic frontal, ethmoid, left sphenoid, and bilateral maxillary sinusitis. Soft tissues: Although the patient's metal fillings partially obscure some of the area around the mandible, and we do not have IV contrast to  assess for enhancement, I do not demonstrate a definite abscess along the mandible on today's exam to account for the patient's jaw pain. No significant regional adenopathy. No thickening of the epiglottis. No significant mandibular condylar abnormality. Upper cervical spine unremarkable. IMPRESSION: 1. No significant intracranial abnormality. 2. Mild chronic paranasal sinusitis. No other appreciable facial abnormality. No periapical lucency associated with the teeth. Electronically Signed   By: Van Clines M.D.   On: 04/24/2018 14:02     ANTIBIOTICS Anti-infectives (From admission, onward)   Start     Dose/Rate Route Frequency Ordered Stop   04/26/18 1100  vancomycin (VANCOCIN) 1,500 mg in sodium chloride 0.9 % 500 mL IVPB  Status:  Discontinued     1,500 mg 250 mL/hr over 120 Minutes Intravenous Every 12 hours 04/26/18 1015 04/26/18 1232   04/25/18 1000  vancomycin (VANCOCIN) 1,500 mg in sodium chloride 0.9 % 500 mL IVPB  Status:  Discontinued      1,500 mg 250 mL/hr over 120 Minutes Intravenous Every 12 hours 04/24/18 2001 04/26/18 1011   04/24/18 2200  piperacillin-tazobactam (ZOSYN) IVPB 3.375 g     3.375 g 12.5 mL/hr over 240 Minutes Intravenous Every 8 hours 04/24/18 2001     04/24/18 2100  vancomycin (VANCOCIN) 2,000 mg in sodium chloride 0.9 % 500 mL IVPB     2,000 mg 250 mL/hr over 120 Minutes Intravenous  Once 04/24/18 2001 04/24/18 2235   04/24/18 1400  azithromycin (ZITHROMAX) tablet 500 mg  Status:  Discontinued     500 mg Oral Daily 04/24/18 1213 04/24/18 2135   04/23/18 1400  azithromycin (ZITHROMAX) 500 mg in sodium chloride 0.9 % 250 mL IVPB  Status:  Discontinued     500 mg 250 mL/hr over 60 Minutes Intravenous Every 24 hours 04/22/18 1531 04/24/18 1213   04/22/18 1545  cefTRIAXone (ROCEPHIN) 1 g in sodium chloride 0.9 % 100 mL IVPB  Status:  Discontinued     1 g 200 mL/hr over 30 Minutes Intravenous Every 24 hours 04/22/18 1531 04/24/18 1934   04/22/18 1400  levofloxacin (LEVAQUIN) IVPB 500 mg     500 mg 100 mL/hr over 60 Minutes Intravenous  Once 04/22/18 1347 04/22/18 1550         Assessment & Plan:  Acute respiratory failure with hypoxia (HCC) Improved after steroids esp on 04/26/2018 after increased steroids yesterday Autoimmune and vasculitis negative so far though ANCA pending  Features c/w VAPE lung +/- superimposd bacterial process  Plan Continue current dose of steroids -> aim for half of solumedrol 04/27/18 and then over next few days get to prednisone 61m and from there 3 weeks pred taper bipap 2h on and 2h off in day + QHS and gradually taper CXR prn O2 for piulse ox > 88% ABx as below Await ANCA profile  Marijuana user/Vapes THC Needs to report to poison control/CDC  Sepsis ([facility] Sinusitis v pneumonia v both  Plan DC vanc Continue zosyn for now TRH to aim to narrow 04/27/18    Anxiety Xanax at night     Best Practice / Goals of Care / Disposition.   DVT prophylaxis:  heparin SQ GI prophylaxis: pepcid Diet: regular diet Mobility:bed rest -> oob chair Code Status: full code Family Communication: wife Eric [name] and patient updated 04/25/18 and patient 04/26/2018    Disposition / Summary of Today's Plan 04/26/2018   CCM will sign off but availabe for questions / recall    SIGNATURE    Dr. MBrand Males M.D., F.C.C.P,  Pulmonary and Critical Care Medicine Staff Physician, [place] Director - Interstitial Lung Disease  Program  P[facility] [place] [facility] [place], [place], [address]  Pager: [phone], If no answer or between  15:00h - 7:00h: call [phone] Telephone: [phone]  12:41 PM 04/26/2018

## 2018-04-27 ENCOUNTER — Inpatient Hospital Stay (HOSPITAL_COMMUNITY): Payer: 59

## 2018-04-27 DIAGNOSIS — R51 Headache: Secondary | ICD-10-CM

## 2018-04-27 LAB — CBC WITH DIFFERENTIAL/PLATELET
BASOS ABS: 0 10*3/uL (ref 0.0–0.1)
BASOS PCT: 0 %
Eosinophils Absolute: 0 10*3/uL (ref 0.0–0.7)
Eosinophils Relative: 0 %
HCT: 35.2 % — ABNORMAL LOW (ref 39.0–52.0)
HEMOGLOBIN: 12 g/dL — AB (ref 13.0–17.0)
Lymphocytes Relative: 5 %
Lymphs Abs: 0.6 10*3/uL — ABNORMAL LOW (ref 0.7–4.0)
MCH: 30.2 pg (ref 26.0–34.0)
MCHC: 34.1 g/dL (ref 30.0–36.0)
MCV: 88.7 fL (ref 78.0–100.0)
Monocytes Absolute: 0.4 10*3/uL (ref 0.1–1.0)
Monocytes Relative: 3 %
NEUTROS ABS: 10.7 10*3/uL — AB (ref 1.7–7.7)
NEUTROS PCT: 92 %
Platelets: 447 10*3/uL — ABNORMAL HIGH (ref 150–400)
RBC: 3.97 MIL/uL — AB (ref 4.22–5.81)
RDW: 12.6 % (ref 11.5–15.5)
WBC: 11.7 10*3/uL — AB (ref 4.0–10.5)

## 2018-04-27 LAB — BASIC METABOLIC PANEL
ANION GAP: 11 (ref 5–15)
BUN: 13 mg/dL (ref 6–20)
CALCIUM: 9.3 mg/dL (ref 8.9–10.3)
CO2: 27 mmol/L (ref 22–32)
Chloride: 103 mmol/L (ref 98–111)
Creatinine, Ser: 0.74 mg/dL (ref 0.61–1.24)
GFR calc non Af Amer: >60 mL/min (ref >60)
Glucose, Bld: 125 mg/dL — ABNORMAL HIGH (ref 70–99)
POTASSIUM: 3.7 mmol/L (ref 3.5–5.1)
Sodium: 141 mmol/L (ref 135–145)

## 2018-04-27 LAB — GLUCOSE, CAPILLARY
GLUCOSE-CAPILLARY: 111 mg/dL — AB (ref 70–99)
GLUCOSE-CAPILLARY: 130 mg/dL — AB (ref 70–99)
Glucose-Capillary: 133 mg/dL — ABNORMAL HIGH (ref 70–99)
Glucose-Capillary: 121 mg/dL — ABNORMAL HIGH (ref 70–99)

## 2018-04-27 LAB — PHOSPHORUS: PHOSPHORUS: 3.7 mg/dL (ref 2.5–4.6)

## 2018-04-27 LAB — CULTURE, BLOOD (ROUTINE X 2)
CULTURE: NO GROWTH
SPECIAL REQUESTS: ADEQUATE

## 2018-04-27 LAB — PROCALCITONIN: PROCALCITONIN: 0.22 ng/mL

## 2018-04-27 LAB — LEGIONELLA PNEUMOPHILA SEROGP 1 UR AG: L. pneumophila Serogp 1 Ur Ag: NEGATIVE

## 2018-04-27 LAB — CREATININE, SERUM
Creatinine, Ser: 0.73 mg/dL (ref 0.61–1.24)
GFR calc Af Amer: >60 mL/min (ref >60)
GFR calc non Af Amer: >60 mL/min (ref >60)

## 2018-04-27 LAB — MAGNESIUM: Magnesium: 2.1 mg/dL (ref 1.7–2.4)

## 2018-04-27 LAB — LACTIC ACID, PLASMA: LACTIC ACID, VENOUS: 1.3 mmol/L (ref 0.5–1.9)

## 2018-04-27 MED ORDER — ALPRAZOLAM 0.25 MG PO TABS
0.2500 mg | ORAL_TABLET | Freq: Two times a day (BID) | ORAL | Status: DC
  Administered 2018-04-27 – 2018-05-01 (×9): 0.25 mg via ORAL
  Filled 2018-04-27 (×9): qty 1

## 2018-04-27 MED ORDER — LAMOTRIGINE 100 MG PO TABS
100.0000 mg | ORAL_TABLET | Freq: Every day | ORAL | Status: DC
  Administered 2018-04-27 – 2018-04-30 (×4): 100 mg via ORAL
  Filled 2018-04-27 (×4): qty 1

## 2018-04-27 MED ORDER — CITALOPRAM HYDROBROMIDE 20 MG PO TABS
20.0000 mg | ORAL_TABLET | Freq: Every day | ORAL | Status: DC
  Administered 2018-04-27 – 2018-05-01 (×5): 20 mg via ORAL
  Filled 2018-04-27 (×5): qty 1

## 2018-04-27 MED ORDER — VANCOMYCIN HCL 10 G IV SOLR
1500.0000 mg | Freq: Two times a day (BID) | INTRAVENOUS | Status: DC
  Administered 2018-04-27 – 2018-05-01 (×8): 1500 mg via INTRAVENOUS
  Filled 2018-04-27 (×9): qty 1500

## 2018-04-27 MED ORDER — INSULIN ASPART 100 UNIT/ML ~~LOC~~ SOLN
2.0000 [IU] | SUBCUTANEOUS | Status: DC
  Administered 2018-04-27 – 2018-04-28 (×4): 2 [IU] via SUBCUTANEOUS
  Administered 2018-04-28: 4 [IU] via SUBCUTANEOUS
  Administered 2018-04-28 – 2018-04-29 (×6): 2 [IU] via SUBCUTANEOUS
  Administered 2018-04-29 – 2018-05-01 (×4): 4 [IU] via SUBCUTANEOUS

## 2018-04-27 MED ORDER — POTASSIUM CHLORIDE CRYS ER 20 MEQ PO TBCR
40.0000 meq | EXTENDED_RELEASE_TABLET | Freq: Once | ORAL | Status: AC
  Administered 2018-04-27: 40 meq via ORAL
  Filled 2018-04-27: qty 2

## 2018-04-27 NOTE — Progress Notes (Signed)
Vapor paraphernalia in Care Coordinator office in upper cabinets in a bag with the patient's sticker on bag. Patient's wife bought the items. Charge Nurse Redwood RN aware and witnessed the bag being put in the cabinet.

## 2018-04-27 NOTE — Progress Notes (Addendum)
Pharmacy Antibiotic Note  Eric [name] is a 32 y.o. male admitted on 04/22/2018 with worsening respiratory failure.  Pharmacy has been consulted for vancomycin and Zosyn dosing. Note that vanc was d/c'd 9/10 but now reordered by Md for fevers today  Plan:  Continue current Zosyn dosing  Restart vanc at previous dosing of 1500mg  IV q12 - goal AUC 400-500  Monitor clinical course, renal function, cultures as available    Height: 5\' 11"  (180.3 cm) Weight: 179 lb 3.7 oz (81.3 kg) IBW/kg (Calculated) : 75.3  Temp (24hrs), Avg:99.2 F (37.3 C), Min:98.3 F (36.8 C), Max:100.5 F (38.1 C)  Recent Labs  Lab 04/22/18 1107 04/23/18 0529 04/24/18 0522 04/24/18 2044 04/24/18 2351 04/25/18 0335 04/27/18 0335 04/27/18 0943  WBC  --  11.8* 12.1* 12.5*  --  8.3  --  11.7*  CREATININE  --  0.77 0.78 0.86  --  0.77 0.73 0.74  LATICACIDVEN 1.27  --   --  1.8 1.5  --   --  1.3    Estimated Creatinine Clearance: 138.6 mL/min (by C-G formula based on SCr of 0.74 mg/dL).    No Known Allergies  Antimicrobials this admission: 9/6 ceftriaxone >> 9/8 9/6 azithromycin >> 9/10 9/8 vancomycin >> 9/10, 9/11 >> 9/8 Zosyn >>   Dose adjustments this admission: ---  Microbiology results:  9/6 BCx: NGTD 9/7 HIV antibody: negative  9/8 Legionella antigen: pending 9/8 MRSA PCR: positive 9/8 sputum: unable to get good sample 9/8 strep pneumo: negative   Thank you for allowing pharmacy to be a part of this patient's care.   Adrian Saran, PharmD, BCPS Pager [phone] 04/27/2018 12:44 PM

## 2018-04-27 NOTE — Progress Notes (Signed)
Pt. has remained off BiPAP since 04/26/18 last charted 1500 hours, has remained on 4 lpm humidified n/c, with minimal WOB, was made aware to notify if feel/becoming more SOB, RT/RN in to see pt. fequently, saturations remained high 90's, wife remains at bedside, b/l b.s. becoming more loose with given humidified n/c oxygen, resting comfortably, aerosols remaining Q6 with Bid Pulmicort, RT to monitor.

## 2018-04-27 NOTE — Progress Notes (Signed)
Eric [name]  MRN: [id number]  DOB: 1986/06/07  DOA: 04/22/2018 Date of Consult: 04/24/18  LOS 5 days  PCP: Haywood Pao, MD   Reason for Consult / Chief Complaint:  Acute resp failure associated with [place]  Consulting MD:  Dr Alcario Drought of [facility]  HPI/Brief Narrative   39yoM with hx Asthma, Vaping, Bipolar disorder, Anxiety, and Depression, who presented to the hospital on 9/6 c/o cough, fever, HA, SOB x 2-3 days. He denies recent admissions, travel, or sick contacts. On admission, he was found to have bilateral pulmonary infiltrates thought most likely to represent pneumonia. He was started on antibiotics for community acquired pneumonia, but has continued to have tachypnea, tachycardia, and intermittent fevers (up to 102.9). He has had worsening hypoxia as well, initially requiring 3L O2, then up to 15L NRB on 9/7. Now patient with RR reportedly in 30-40's on NRB and was transitioned to BIPAP for work of breathing. PCCM then consulted. At the time of my exam patient is already on BIPAP. He says he is still SOB but that its much much better (gives 2 thumbs up when answering this question). Reported productive cough, fever, HA, but no vomiting, wheezing, or CP. His wife is present during the exam and discussed diagnosis and workup with her as well.    EVENTS   04/22/2018 - admit 9/7 - UDS - positive marjuama HIV negative 04/24/18 - ccm consult    04/25/2018  -RVP negative, urine strep negative (all yesterday). PCT marginally elevated. UDS positive for marijuana. Wife Roxanne at bedside - says they both smoke MJ. Patient started Vaping street black market MJ some 6-62month ago. Wife says he is 50% better since starting steroids last night. This am off bipap and feeling better but still dyspneic class 4 ECHO - ef 65%  T max 103F   92/40/97- Vaping hx elicited in mor detail and reported to state poison control   - black market MJ use; [employer] - since Feb 2019 - using 1g every  2-3 days - he and wife together - device capped on both ends but cap not always secure - he is worried that someone might have contaminated this. Patient says he did not alter them in anyway - he does have used cartridges to give to poison control if needed from home  - afebrile. WBC improving. On 3L [place] and BiPAP QHS with day time on/off. Says he tolerated toileting much better today. Still feels he wants to do bipap on-off in day. RN thinks he is doing better slowly.  ESR 160. Otehrwise autoimmune and ance negative but for trace positive RF     SUBJECTIVE/OVERNIGHT/INTERVAL HX    04/27/2018 - appears to have remained off bipap. Febrile overnight 101F with rising wbc but PCT down and lactate normal. Possible cxr worsening in 2 days in [place] (unsure per my personal visualization). Did not use bipap overnight but did this morning. Mom thinks much bettter than 2-3d ago but maybe a bit worse since yesterday  Objective    Vitals:   04/27/18 0500 04/27/18 0800 04/27/18 1220 04/27/18 1236  BP: (!) 161/84     Pulse: 66  69 64  Resp: (!) 31  (!) 29 (!) 23  Temp:  99.3 F (37.4 C)  98.5 F (36.9 C)  TempSrc:  Axillary  Oral  SpO2: 96%  94% 94%  Weight:      Height:        FAutoliv  04/24/18 1939 04/25/18 0500 04/26/18 1100  Weight: 81.2 kg 80.5 kg 81.3 kg     Vent Mode: PSV;CPAP FiO2 (%):  [30 %] 30 % PEEP:  [8 cmH20] 8 cmH20 Pressure Support:  [5 cmH20] 5 cmH20   Physical Exam: General Appearance:  Looks samle. Deconditioned Head:  Normocephalic, without obvious abnormality, atraumatic Eyes:  PERRL - yes, conjunctiva/corneas - clea45     Ears:  Normal external ear canals, both ears Nose:  G tube - no but has [place] Throat:  ETT TUBE - no , OG tube - no Neck:  Supple,  No enlargement/tenderness/nodules Lungs: Clear to auscultation bilaterally overall but at lung base has crackles Heart:  S1 and S2 normal, no murmur, CVP - no.  Pressors - no Abdomen:  Soft, no masses,  no organomegaly Genitalia / Rectal:  Not done Extremities:  Extremities- intact Skin:  ntact in exposed areas .  Neurologic:  Sedation - none -> RASS - +1 . Moves all 4s - yes. CAM-ICU - neg . Orientation - x3+        Labs   PULMONARY Recent Labs  Lab 04/24/18 1948 04/25/18 0359  PHART 7.411 7.384  PCO2ART 37.5 41.0  PO2ART 120* 144*  HCO3 23.4 24.0  O2SAT 98.8 98.8    CBC Recent Labs  Lab 04/24/18 2044 04/25/18 0335 04/27/18 0943  HGB 12.7* 11.6* 12.0*  HCT 37.1* 34.2* 35.2*  WBC 12.5* 8.3 11.7*  PLT 359 329 447*    COAGULATION No results for input(s): INR in the last 168 hours.  CARDIAC  No results for input(s): TROPONINI in the last 168 hours. No results for input(s): PROBNP in the last 168 hours.   CHEMISTRY Recent Labs  Lab 04/23/18 0529 04/24/18 0522 04/24/18 2044 04/25/18 0335 04/27/18 0335 04/27/18 0943  NA 138 140 139 140  --  141  K 4.0 4.3 4.3 3.8  --  3.7  CL 103 105 104 106  --  103  CO2 '25 24 23 23  '$ --  27  GLUCOSE 108* 108* 122* 163*  --  125*  BUN '10 11 9 16  '$ --  13  CREATININE 0.77 0.78 0.86 0.77 0.73 0.74  CALCIUM 8.9 9.1 9.4 9.0  --  9.3  MG  --   --   --  2.4  --  2.1  PHOS  --   --   --  2.6  --  3.7   Estimated Creatinine Clearance: 138.6 mL/min (by C-G formula based on SCr of 0.74 mg/dL).   LIVER Recent Labs  Lab 04/22/18 1046 04/24/18 2044  AST 22 24  ALT 16 15  ALKPHOS 107 87  BILITOT 2.0* 0.8  PROT 8.9* 7.5  ALBUMIN 4.2 2.8*     INFECTIOUS Recent Labs  Lab 04/24/18 2044 04/24/18 2351 04/25/18 0335 04/26/18 0311 04/27/18 0943  LATICACIDVEN 1.8 1.5  --   --  1.3  PROCALCITON 1.69  --  1.28 0.82 0.22     ENDOCRINE CBG (last 3)  Recent Labs    04/26/18 1710 04/26/18 2324 04/27/18 0604  GLUCAP 149* 118* 111*         IMAGING x48h  - image(s) personally visualized  -   highlighted in bold Dg Chest Port 1 View  Result Date: 04/27/2018 CLINICAL DATA:  Cough, fever, shortness of breath.  EXAM: PORTABLE CHEST 1 VIEW COMPARISON:  04/25/2018 FINDINGS: Patchy bilateral airspace disease again noted with lower lobe predominance, as well as interstitial prominence. Slightly worsened since  prior study. Heart is upper limits normal in size. No visible significant effusions or acute bony abnormality. IMPRESSION: Slight interval worsening in patchy bilateral lower lobe airspace opacities and interstitial prominence within the lungs. This could reflect edema or infection. Electronically Signed   By: Rolm Baptise M.D.   On: 04/27/2018 11:30     ANTIBIOTICS Anti-infectives (From admission, onward)   Start     Dose/Rate Route Frequency Ordered Stop   04/26/18 1100  vancomycin (VANCOCIN) 1,500 mg in sodium chloride 0.9 % 500 mL IVPB  Status:  Discontinued     1,500 mg 250 mL/hr over 120 Minutes Intravenous Every 12 hours 04/26/18 1015 04/26/18 1232   04/25/18 1000  vancomycin (VANCOCIN) 1,500 mg in sodium chloride 0.9 % 500 mL IVPB  Status:  Discontinued     1,500 mg 250 mL/hr over 120 Minutes Intravenous Every 12 hours 04/24/18 2001 04/26/18 1011   04/24/18 2200  piperacillin-tazobactam (ZOSYN) IVPB 3.375 g     3.375 g 12.5 mL/hr over 240 Minutes Intravenous Every 8 hours 04/24/18 2001     04/24/18 2100  vancomycin (VANCOCIN) 2,000 mg in sodium chloride 0.9 % 500 mL IVPB     2,000 mg 250 mL/hr over 120 Minutes Intravenous  Once 04/24/18 2001 04/24/18 2235   04/24/18 1400  azithromycin (ZITHROMAX) tablet 500 mg  Status:  Discontinued     500 mg Oral Daily 04/24/18 1213 04/24/18 2135   04/23/18 1400  azithromycin (ZITHROMAX) 500 mg in sodium chloride 0.9 % 250 mL IVPB  Status:  Discontinued     500 mg 250 mL/hr over 60 Minutes Intravenous Every 24 hours 04/22/18 1531 04/24/18 1213   04/22/18 1545  cefTRIAXone (ROCEPHIN) 1 g in sodium chloride 0.9 % 100 mL IVPB  Status:  Discontinued     1 g 200 mL/hr over 30 Minutes Intravenous Every 24 hours 04/22/18 1531 04/24/18 1934   04/22/18 1400   levofloxacin (LEVAQUIN) IVPB 500 mg     500 mg 100 mL/hr over 60 Minutes Intravenous  Once 04/22/18 1347 04/22/18 1550         Assessment & Plan:  Acute respiratory failure with hypoxia (HCC) Autoimmune and vasculitis negative. Features c/w VAPE lung +/- superimposd bacterial process  - Improved after steroids esp on 04/26/2018 after increased steroids onm9/19/19 - some worse mayb e9/11/19 but correlates with not usin bipap and repeat fever/wbc/posslbe cxr worsening - ? HAP  Plan - Continue current dose of steroids -> aim for half of solumedrol 04/28/18 and then over next few days get to prednisone '60mg'$  and from there 3 weeks pred taper - restart vanc - continue zosyn. If still SIRS /sepsis - > then change zosun to cefepime - bipap 2h on and 2h off in day + QHS and gradually taper - O2 for piulse ox > 88%    Marijuana user/Vapes THC Reported to [place] [facility]9/10/19  Sepsis (HCC) Sinusitis v pneumonia v both. rpeat fever 04/26/18 with rising wbc 04/27/18  Plan Re add vanc 04/27/18 Continue zosyn for now    Anxiety Xanax at night     Best Practice / Goals of Care / Disposition.   DVT prophylaxis: heparin SQ GI prophylaxis: pepcid Diet: regular diet Mobility:bed rest -> oob chair Code Status: full code Family Communication: wife roxanne and patient updated 04/25/18 and patient 04/27/2018    Disposition / Summary of Today's Plan 04/27/2018   Ccm will continue to follow - important he gets bipap qhs    SIGNATURE  Dr. Brand Males, M.D., F.C.C.P,  Pulmonary and Critical Care Medicine Staff Physician, Medicine [place] Director - Interstitial Lung Disease  Program  [facility] [place] [facility] [place], [place], [address]  Pager: [phone], If no answer or between  15:00h - 7:00h: call [phone] Telephone: [phone]  12:40 PM 04/27/2018

## 2018-04-27 NOTE — Progress Notes (Signed)
PROGRESS NOTE    Eric [name]  QIH:[id number] DOB: [birth year] DOA: 04/22/2018 PCP: Haywood Pao, MD  Brief Narrative:  32 y.o.malewith past medical history relevant for bipolar disorder, mutant asthma who presents with 2 to 3 days of cough, fevers, shortness of breath fatigue and malaise, No sick contacts, no recent travels, patient has nausea but no vomiting Additional history obtained from patient's mother at bedside Patient has no prior history of hospitalizations as an adult, no prior history of ED visits for asthma no prior intubations or ICU admissions He was in his usual state of health until 2 to 3 days ago when he started to have respiratory symptoms  Interim history Admitted for sepsis secondary to pneumonia.  Patient became hypoxic with respiratory distress, was transferred to stepdown and placed on BiPAP.  Patient was she started on azithromycin and ceftriaxone however antibiotics were broadened.  Was started on IV Solu-Medrol.  PCCM consulted Assessment & Plan:   Principal Problem:   Acute respiratory failure with hypoxia ([place]) Active Problems:   Mild persistent asthma   Allergic rhinitis   CAP (community acquired pneumonia)/Bilateral   Marijuana user/Vapes THC   Sepsis ([place])   Anxiety   Acute hypoxic respiratory failure due to vape THC use  -Upon admission, patient was found to have hypoxia with a oxygen saturation of 86% on room air and at rest -Echocardiogram shows an EF of 60 to 65%, no regional wall motion abnormalities --PCCM  appreciated -Continue Pulmicort, nebulizer treatments as well as IV steroids and vancomycin and Zosyn -Pending sputum culture -Respiratory viral panel negative -RF, Sjogren A/B, Anti-scl Ab, glomerular basement negative -CT chest shows bilateral groundglass opacities.  Pneumonia urine antigen negative.  Bipolar disorder/depression/anxiety -Increase Xanax twice daily as he was taking at home.  Restart Celexa and Lamictal.  It  appears that he takes both Celexa and Zoloft at home.  I will restart Celexa and hold Zoloft.  Will discuss with his wife.   THC abuse -Admits to using THC vaping -Counseled on cessation -Drug screen +THC  Headache  -Toradol, Fioricet as well as Phenergan as needed -possibly be worsened by coughing, nasal congestion -CT head no significant or cranial abnormality   DVT prophylaxis:HEPARIN Code Status full  Family Communication:dw wife Disposition Plan:tbd Consultants: pccm  Procedures:none Antimicrobials Vanco and Zosyn  Subjective: Patient resting in bed currently on nasal cannula wife by the bedside she reports he was very anxious and was restless at night. did Not use of BiPAP overnight.   Objective: Vitals:   04/27/18 0500 04/27/18 0800 04/27/18 1220 04/27/18 1236  BP: (!) 161/84     Pulse: 66  69 64  Resp: (!) 31  (!) 29 (!) 23  Temp:  99.3 F (37.4 C)  98.5 F (36.9 C)  TempSrc:  Axillary  Oral  SpO2: 96%  94% 94%  Weight:      Height:        Intake/Output Summary (Last 24 hours) at 04/27/2018 1535 Last data filed at 04/27/2018 1136 Gross per 24 hour  Intake 1072.76 ml  Output 800 ml  Net 272.76 ml   Filed Weights   04/24/18 1939 04/25/18 0500 04/26/18 1100  Weight: 81.2 kg 80.5 kg 81.3 kg    Examination:  General exam: Appears calm and comfortable  Respiratory system: Coarse  auscultation. Respiratory effort normal. Cardiovascular system: S1 & S2 heard, RRR. No JVD, murmurs, rubs, gallops or clicks. No pedal edema. Gastrointestinal system: Abdomen is nondistended, soft and nontender. No  organomegaly or masses felt. Normal bowel sounds heard. Extremities: Symmetric 5 x 5 power. Skin: No rashes, lesions or ulcers    Data Reviewed: I have personally reviewed following labs and imaging studies  CBC: Recent Labs  Lab 04/22/18 1046 04/23/18 0529 04/24/18 0522 04/24/18 2044 04/25/18 0335 04/27/18 0943  WBC 16.4* 11.8* 12.1* 12.5* 8.3 11.7*    NEUTROABS 14.7*  --   --   --   --  10.7*  HGB 15.4 12.9* 12.5* 12.7* 11.6* 12.0*  HCT 42.6 37.2* 36.4* 37.1* 34.2* 35.2*  MCV 87.3 88.4 88.1 89.4 89.3 88.7  PLT 320 279 304 359 329 102*   Basic Metabolic Panel: Recent Labs  Lab 04/23/18 0529 04/24/18 0522 04/24/18 2044 04/25/18 0335 04/27/18 0335 04/27/18 0943  NA 138 140 139 140  --  141  K 4.0 4.3 4.3 3.8  --  3.7  CL 103 105 104 106  --  103  CO2 25 24 23 23   --  27  GLUCOSE 108* 108* 122* 163*  --  125*  BUN 10 11 9 16   --  13  CREATININE 0.77 0.78 0.86 0.77 0.73 0.74  CALCIUM 8.9 9.1 9.4 9.0  --  9.3  MG  --   --   --  2.4  --  2.1  PHOS  --   --   --  2.6  --  3.7   GFR: Estimated Creatinine Clearance: 138.6 mL/min (by C-G formula based on SCr of 0.74 mg/dL). Liver Function Tests: Recent Labs  Lab 04/22/18 1046 04/24/18 2044  AST 22 24  ALT 16 15  ALKPHOS 107 87  BILITOT 2.0* 0.8  PROT 8.9* 7.5  ALBUMIN 4.2 2.8*   No results for input(s): LIPASE, AMYLASE in the last 168 hours. No results for input(s): AMMONIA in the last 168 hours. Coagulation Profile: No results for input(s): INR, PROTIME in the last 168 hours. Cardiac Enzymes: No results for input(s): CKTOTAL, CKMB, CKMBINDEX, TROPONINI in the last 168 hours. BNP (last 3 results) No results for input(s): PROBNP in the last 8760 hours. HbA1C: No results for input(s): HGBA1C in the last 72 hours. CBG: Recent Labs  Lab 04/26/18 0546 04/26/18 1138 04/26/18 1710 04/26/18 2324 04/27/18 0604  GLUCAP 128* 159* 149* 118* 111*   Lipid Profile: No results for input(s): CHOL, HDL, LDLCALC, TRIG, CHOLHDL, LDLDIRECT in the last 72 hours. Thyroid Function Tests: No results for input(s): TSH, T4TOTAL, FREET4, T3FREE, THYROIDAB in the last 72 hours. Anemia Panel: No results for input(s): VITAMINB12, FOLATE, FERRITIN, TIBC, IRON, RETICCTPCT in the last 72 hours. Sepsis Labs: Recent Labs  Lab 04/22/18 1107 04/24/18 2044 04/24/18 2351 04/25/18 0335  04/26/18 0311 04/27/18 0943  PROCALCITON  --  1.69  --  1.28 0.82 0.22  LATICACIDVEN 1.27 1.8 1.5  --   --  1.3    Recent Results (from the past 240 hour(s))  Culture, blood (Routine X 2) w Reflex to ID Panel     Status: None   Collection Time: 04/22/18  4:24 PM  Result Value Ref Range Status   Specimen Description   Final    RIGHT ANTECUBITAL Performed at [facility], [place] [address] [place], [place] [address]    Special Requests   Final    BOTTLES DRAWN AEROBIC AND ANAEROBIC Blood Culture adequate volume Performed at [place] [address], [place], [place] [address]    Culture   Final    NO GROWTH 5 DAYS  Performed at [facility], [place] [address], [place], [place] [address]    Report Status 04/27/2018 FINAL  Final  MRSA PCR Screening     Status: Abnormal   Collection Time: 04/24/18  7:52 PM  Result Value Ref Range Status   MRSA by PCR POSITIVE (A) NEGATIVE Final    Comment:        The GeneXpert MRSA Assay (FDA approved for NASAL specimens only), is one component of a comprehensive MRSA colonization surveillance program. It is not intended to diagnose MRSA infection nor to guide or monitor treatment for MRSA infections. RESULT CALLED TO, READ BACK BY AND VERIFIED WITH: KALLAM AT 2351 ON 04/24/2018 BY MOSLEY,J Performed at [facility], [place] [address] [place], [place] [address]   Culture, sputum-assessment     Status: None   Collection Time: 04/24/18  8:23 PM  Result Value Ref Range Status   Specimen Description EXPECTORATED SPUTUM  Final   Special Requests NONE  Final   Sputum evaluation   Final    Sputum specimen not acceptable for testing.  Please recollect.    NOTIFIED K,WASHIGTON AT 2225 ON 04/24/18 BY A,MOHAMED Performed at [facility], [place] [address], [place], [place] [address]    Report Status 04/24/2018 FINAL  Final  Respiratory Panel by PCR     Status: None    Collection Time: 04/24/18  8:23 PM  Result Value Ref Range Status   Adenovirus NOT DETECTED NOT DETECTED Final   Coronavirus 229E NOT DETECTED NOT DETECTED Final   Coronavirus HKU1 NOT DETECTED NOT DETECTED Final   Coronavirus NL63 NOT DETECTED NOT DETECTED Final   Coronavirus OC43 NOT DETECTED NOT DETECTED Final   Metapneumovirus NOT DETECTED NOT DETECTED Final   Rhinovirus / Enterovirus NOT DETECTED NOT DETECTED Final   Influenza A NOT DETECTED NOT DETECTED Final   Influenza B NOT DETECTED NOT DETECTED Final   Parainfluenza Virus 1 NOT DETECTED NOT DETECTED Final   Parainfluenza Virus 2 NOT DETECTED NOT DETECTED Final   Parainfluenza Virus 3 NOT DETECTED NOT DETECTED Final   Parainfluenza Virus 4 NOT DETECTED NOT DETECTED Final   Respiratory Syncytial Virus NOT DETECTED NOT DETECTED Final   Bordetella pertussis NOT DETECTED NOT DETECTED Final   Chlamydophila pneumoniae NOT DETECTED NOT DETECTED Final   Mycoplasma pneumoniae NOT DETECTED NOT DETECTED Final    Comment: Performed at [facility], [place] [address], [place], [place] [address]  Culture, blood (routine x 2) Call MD if unable to obtain prior to antibiotics being given     Status: None (Preliminary result)   Collection Time: 04/24/18  8:44 PM  Result Value Ref Range Status   Specimen Description BLOOD RIGHT HAND  Final   Special Requests   Final    BOTTLES DRAWN AEROBIC ONLY Blood Culture adequate volume Performed at [place] [address] [place], [place] [address]    Culture   Final    NO GROWTH 2 DAYS Performed at [place] [address], [place], [place] [address]    Report Status PENDING  Incomplete  Culture, blood (routine x 2) Call MD if unable to obtain prior to antibiotics being given     Status: None (Preliminary result)   Collection Time: 04/24/18  8:44 PM  Result Value Ref Range Status   Specimen Description   Final    BLOOD RIGHT ANTECUBITAL Performed at [place] [address]  [place], [place] [address]    Special Requests   Final    BOTTLES DRAWN AEROBIC ONLY Blood Culture adequate volume Performed at [place] [address] [place], [place] [address]    Culture   Final    NO GROWTH 2 DAYS Performed at [facility] [address], [place], [place] [address]    Report Status PENDING  Incomplete  Expectorated sputum assessment w rflx to resp cult     Status: None   Collection Time: 04/25/18 10:55 AM  Result Value Ref Range Status   Specimen Description SPU EXPECTORATED  Final   Special Requests Normal  Final   Sputum evaluation   Final    Sputum specimen not acceptable for testing.  Please recollect.   RESULTS CALLED TO [facility] [id number] @ [place] Performed at [place] [address] [place], [place] [address]    Report Status 04/25/2018 FINAL  Final         Radiology Studies: Dg Chest Port 1 View  Result Date: 04/27/2018 CLINICAL DATA:  Cough, fever, shortness of breath. EXAM: PORTABLE CHEST 1 VIEW COMPARISON:  04/25/2018 FINDINGS: Patchy bilateral airspace disease again noted with lower lobe predominance, as well as interstitial prominence. Slightly worsened since prior study. Heart is upper limits normal in size. No visible significant effusions or acute bony abnormality. IMPRESSION: Slight interval worsening in patchy bilateral lower lobe airspace opacities and interstitial prominence within the lungs. This could reflect edema or infection. Electronically Signed   By: Rolm Baptise M.D.   On: 04/27/2018 11:30        Scheduled Meds: . ALPRAZolam  0.25 mg Oral BID  . budesonide (PULMICORT) nebulizer solution  0.5 mg Nebulization BID  . Chlorhexidine Gluconate Cloth  6 each Topical Q0600  . citalopram  20 mg Oral Daily  . fluticasone  2 spray Each Nare Daily  . heparin  5,000 Units Subcutaneous Q8H  . insulin aspart  2-6 Units  Subcutaneous Q4H  . ipratropium-albuterol  3 mL Nebulization Q6H  . lamoTRIgine  100 mg Oral QHS  . methylPREDNISolone (SOLU-MEDROL) injection  125 mg Intravenous Q6H  . mupirocin ointment  1 application Nasal BID  . sodium chloride flush  3 mL Intravenous Q12H   Continuous Infusions: . sodium chloride 10 mL (04/24/18 2222)  . sodium chloride 10 mL/hr at 04/27/18 1236  . sodium chloride    . famotidine (PEPCID) IV Stopped (04/27/18 1204)  . piperacillin-tazobactam (ZOSYN)  IV 3.375 g (04/27/18 1332)  . vancomycin 1,500 mg (04/27/18 1334)     LOS: 5 days    Georgette Shell, MD [facility]  If 7PM-7AM, please contact night-coverage [email or web address] Password [email or web address] 04/27/2018, 3:35 PM

## 2018-04-28 LAB — GLUCOSE, CAPILLARY
GLUCOSE-CAPILLARY: 99 mg/dL (ref 70–99)
Glucose-Capillary: 123 mg/dL — ABNORMAL HIGH (ref 70–99)
Glucose-Capillary: 128 mg/dL — ABNORMAL HIGH (ref 70–99)
Glucose-Capillary: 151 mg/dL — ABNORMAL HIGH (ref 70–99)
Glucose-Capillary: 155 mg/dL — ABNORMAL HIGH (ref 70–99)

## 2018-04-28 LAB — CREATININE, SERUM: Creatinine, Ser: 0.69 mg/dL (ref 0.61–1.24)

## 2018-04-28 LAB — HEPATIC FUNCTION PANEL
ALBUMIN: 2.3 g/dL — AB (ref 3.5–5.0)
ALT: 44 U/L (ref 0–44)
AST: 35 U/L (ref 15–41)
Alkaline Phosphatase: 73 U/L (ref 38–126)
BILIRUBIN DIRECT: 0.2 mg/dL (ref 0.0–0.2)
Indirect Bilirubin: 0.3 mg/dL (ref 0.3–0.9)
TOTAL PROTEIN: 6.1 g/dL — AB (ref 6.5–8.1)
Total Bilirubin: 0.5 mg/dL (ref 0.3–1.2)

## 2018-04-28 LAB — CBC WITH DIFFERENTIAL/PLATELET
BASOS ABS: 0 10*3/uL (ref 0.0–0.1)
BASOS PCT: 0 %
EOS ABS: 0 10*3/uL (ref 0.0–0.7)
Eosinophils Relative: 0 %
HEMATOCRIT: 34 % — AB (ref 39.0–52.0)
HEMOGLOBIN: 11.5 g/dL — AB (ref 13.0–17.0)
Lymphocytes Relative: 5 %
Lymphs Abs: 0.6 10*3/uL — ABNORMAL LOW (ref 0.7–4.0)
MCH: 30.1 pg (ref 26.0–34.0)
MCHC: 33.8 g/dL (ref 30.0–36.0)
MCV: 89 fL (ref 78.0–100.0)
Monocytes Absolute: 0.2 10*3/uL (ref 0.1–1.0)
Monocytes Relative: 2 %
NEUTROS ABS: 10.9 10*3/uL — AB (ref 1.7–7.7)
NEUTROS PCT: 93 %
Platelets: 437 10*3/uL — ABNORMAL HIGH (ref 150–400)
RBC: 3.82 MIL/uL — AB (ref 4.22–5.81)
RDW: 12.7 % (ref 11.5–15.5)
WBC: 11.7 10*3/uL — AB (ref 4.0–10.5)

## 2018-04-28 LAB — BASIC METABOLIC PANEL
Anion gap: 10 (ref 5–15)
BUN: 15 mg/dL (ref 6–20)
CO2: 27 mmol/L (ref 22–32)
CREATININE: 0.67 mg/dL (ref 0.61–1.24)
Calcium: 8.9 mg/dL (ref 8.9–10.3)
Chloride: 104 mmol/L (ref 98–111)
GLUCOSE: 134 mg/dL — AB (ref 70–99)
Potassium: 3.4 mmol/L — ABNORMAL LOW (ref 3.5–5.1)
Sodium: 141 mmol/L (ref 135–145)

## 2018-04-28 LAB — MAGNESIUM: MAGNESIUM: 2.2 mg/dL (ref 1.7–2.4)

## 2018-04-28 LAB — PROCALCITONIN: Procalcitonin: <0.1 ng/mL

## 2018-04-28 LAB — PHOSPHORUS: PHOSPHORUS: 3.4 mg/dL (ref 2.5–4.6)

## 2018-04-28 MED ORDER — POTASSIUM CHLORIDE CRYS ER 20 MEQ PO TBCR
40.0000 meq | EXTENDED_RELEASE_TABLET | Freq: Once | ORAL | Status: AC
  Administered 2018-04-28: 40 meq via ORAL
  Filled 2018-04-28: qty 2

## 2018-04-28 NOTE — Progress Notes (Signed)
Eric [name]  MRN: [id number]  DOB: 1985-09-12  DOA: 04/22/2018 Date of Consult: 04/24/18  LOS 6 days  PCP: Haywood Pao, MD   Reason for Consult / Chief Complaint:  Acute resp failure associated with [place]  Consulting MD:  Dr Alcario Drought of [facility]  HPI/Brief Narrative   65yoM with hx Asthma, Vaping, Bipolar disorder, Anxiety, and Depression, who presented to the hospital on 9/6 c/o cough, fever, HA, SOB x 2-3 days. He denies recent admissions, travel, or sick contacts. On admission, he was found to have bilateral pulmonary infiltrates thought most likely to represent pneumonia. He was started on antibiotics for community acquired pneumonia, but has continued to have tachypnea, tachycardia, and intermittent fevers (up to 102.9). He has had worsening hypoxia as well, initially requiring 3L O2, then up to 15L NRB on 9/7. Now patient with RR reportedly in 30-40's on NRB and was transitioned to BIPAP for work of breathing. PCCM then consulted. At the time of my exam patient is already on BIPAP. He says he is still SOB but that its much much better (gives 2 thumbs up when answering this question). Reported productive cough, fever, HA, but no vomiting, wheezing, or CP. His wife is present during the exam and discussed diagnosis and workup with her as well.    EVENTS   04/22/2018 - admit 9/7 - UDS - positive marjuama HIV negative 04/24/18 - ccm consult    04/25/2018  -RVP negative, urine strep negative (all yesterday). PCT marginally elevated. UDS positive for marijuana. Wife Roxanne at bedside - says they both smoke MJ. Patient started Vaping street black market MJ some 6-80month ago. Wife says he is 50% better since starting steroids last night. This am off bipap and feeling better but still dyspneic class 4 ECHO - ef 65%  T max 103F   94/00/86- Vaping hx elicited in mor detail and reported to state poison control   - black market MJ use; [employer] - since Feb 2019 - using 1g every  2-3 days - he and wife together - device capped on both ends but cap not always secure - he is worried that someone might have contaminated this. Patient says he did not alter them in anyway - he does have used cartridges to give to poison control if needed from home  - afebrile. WBC improving. On 3L [place] and BiPAP QHS with day time on/off. Says he tolerated toileting much better today. Still feels he wants to do bipap on-off in day. RN thinks he is doing better slowly.  ESR 160. Otehrwise autoimmune and ance negative but for trace positive RF  9/11 - - appears to have remained off bipap. Febrile overnight 101F with rising wbc but PCT down and lactate normal. Possible cxr worsening in 2 days in bialtera LL (unsure per my personal visualization). Did not use bipap overnight but did this morning. Mom thinks much bettter than 2-3d ago but maybe a bit worse since yesterday    SUBJECTIVE/OVERNIGHT/INTERVAL HX    04/28/2018 - fever curve better after vancomycin restart. Used BiPAP overnight and on-off in day yesterday. PCT normalized. Feels a bit better. Tolerating 4L [place]. Wants to see if he can mobilize. He is ok going to media with his storya bout vaping but if TV wants tobe anonymous. Patient wants to be able to share his chart with Dr DPattricia Boss- I said was fine  Objective    Vitals:   04/28/18 0400 04/28/18  0500 04/28/18 0600 04/28/18 0800  BP: 127/84 140/73 139/89   Pulse: (!) 48 (!) 51 65   Resp: (!) 26 (!) 22 (!) 29   Temp: 98 F (36.7 C)   98.1 F (36.7 C)  TempSrc:    Oral  SpO2: 96% 97% 97%   Weight:   80.7 kg   Height:        Filed Weights   04/25/18 0500 04/26/18 1100 04/28/18 0600  Weight: 80.5 kg 81.3 kg 80.7 kg     Vent Mode: BIPAP;PSV;Spontaneous FiO2 (%):  [30 %] 30 % PEEP:  [8 cmH20] 8 cmH20 Pressure Support:  [5 cmH20] 5 cmH20   Physical Exam: General Appearance:  Looks much better and fresher Head:  Normocephalic, without obvious abnormality,  atraumatic Eyes:  PERRL - yes, conjunctiva/corneas - clear     Ears:  Normal external ear canals, both ears Nose:  G tube - no but [place] o2 at 4LNC (3L [place] - dropped to 89%) Throat:  ETT TUBE - no , OG tube - no Neck:  Supple,  No enlargement/tenderness/nodules Lungs: Clear to auscultation bilaterall - mostly and imoproved. Occ basal crack;les Heart:  S1 and S2 normal, no murmur, CVP - no.  Pressors - no Abdomen:  Soft, no masses, no organomegaly Genitalia / Rectal:  Not done Extremities:  Extremities- intact Skin:  ntact in exposed areas . Sacral area - intact Neurologic:  Sedation - none -> RASS - +1 . Moves all 4s - yes. CAM-ICU - neg . Orientation - x3+      Labs   PULMONARY Recent Labs  Lab 04/24/18 1948 04/25/18 0359  PHART 7.411 7.384  PCO2ART 37.5 41.0  PO2ART 120* 144*  HCO3 23.4 24.0  O2SAT 98.8 98.8    CBC Recent Labs  Lab 04/25/18 0335 04/27/18 0943 04/28/18 0317  HGB 11.6* 12.0* 11.5*  HCT 34.2* 35.2* 34.0*  WBC 8.3 11.7* 11.7*  PLT 329 447* 437*    COAGULATION No results for input(s): INR in the last 168 hours.  CARDIAC  No results for input(s): TROPONINI in the last 168 hours. No results for input(s): PROBNP in the last 168 hours.   CHEMISTRY Recent Labs  Lab 04/24/18 0522 04/24/18 2044 04/25/18 0335 04/27/18 0335 04/27/18 0943 04/28/18 0317  NA 140 139 140  --  141 141  K 4.3 4.3 3.8  --  3.7 3.4*  CL 105 104 106  --  103 104  CO2 '24 23 23  '$ --  27 27  GLUCOSE 108* 122* 163*  --  125* 134*  BUN '11 9 16  '$ --  13 15  CREATININE 0.78 0.86 0.77 0.73 0.74 0.69  0.67  CALCIUM 9.1 9.4 9.0  --  9.3 8.9  MG  --   --  2.4  --  2.1 2.2  PHOS  --   --  2.6  --  3.7 3.4   Estimated Creatinine Clearance: 138.6 mL/min (by C-G formula based on SCr of 0.67 mg/dL).   LIVER Recent Labs  Lab 04/22/18 1046 04/24/18 2044 04/28/18 0317  AST 22 24 35  ALT 16 15 44  ALKPHOS 107 87 73  BILITOT 2.0* 0.8 0.5  PROT 8.9* 7.5 6.1*  ALBUMIN 4.2 2.8*  2.3*     INFECTIOUS Recent Labs  Lab 04/24/18 2044 04/24/18 2351  04/26/18 0311 04/27/18 0943 04/28/18 0317  LATICACIDVEN 1.8 1.5  --   --  1.3  --   PROCALCITON 1.69  --    < >  0.82 0.22 <0.10   < > = values in this interval not displayed.     ENDOCRINE CBG (last 3)  Recent Labs    04/27/18 1921 04/27/18 2358 04/28/18 0427  GLUCAP 130* 123* 128*         IMAGING x48h  - image(s) personally visualized  -   highlighted in bold Dg Chest Port 1 View  Result Date: 04/27/2018 CLINICAL DATA:  Cough, fever, shortness of breath. EXAM: PORTABLE CHEST 1 VIEW COMPARISON:  04/25/2018 FINDINGS: Patchy bilateral airspace disease again noted with lower lobe predominance, as well as interstitial prominence. Slightly worsened since prior study. Heart is upper limits normal in size. No visible significant effusions or acute bony abnormality. IMPRESSION: Slight interval worsening in patchy bilateral lower lobe airspace opacities and interstitial prominence within the lungs. This could reflect edema or infection. Electronically Signed   By: Rolm Baptise M.D.   On: 04/27/2018 11:30     ANTIBIOTICS Anti-infectives (From admission, onward)   Start     Dose/Rate Route Frequency Ordered Stop   04/27/18 1400  vancomycin (VANCOCIN) 1,500 mg in sodium chloride 0.9 % 500 mL IVPB     1,500 mg 250 mL/hr over 120 Minutes Intravenous Every 12 hours 04/27/18 1245     04/26/18 1100  vancomycin (VANCOCIN) 1,500 mg in sodium chloride 0.9 % 500 mL IVPB  Status:  Discontinued     1,500 mg 250 mL/hr over 120 Minutes Intravenous Every 12 hours 04/26/18 1015 04/26/18 1232   04/25/18 1000  vancomycin (VANCOCIN) 1,500 mg in sodium chloride 0.9 % 500 mL IVPB  Status:  Discontinued     1,500 mg 250 mL/hr over 120 Minutes Intravenous Every 12 hours 04/24/18 2001 04/26/18 1011   04/24/18 2200  piperacillin-tazobactam (ZOSYN) IVPB 3.375 g     3.375 g 12.5 mL/hr over 240 Minutes Intravenous Every 8 hours 04/24/18  2001     04/24/18 2100  vancomycin (VANCOCIN) 2,000 mg in sodium chloride 0.9 % 500 mL IVPB     2,000 mg 250 mL/hr over 120 Minutes Intravenous  Once 04/24/18 2001 04/24/18 2235   04/24/18 1400  azithromycin (ZITHROMAX) tablet 500 mg  Status:  Discontinued     500 mg Oral Daily 04/24/18 1213 04/24/18 2135   04/23/18 1400  azithromycin (ZITHROMAX) 500 mg in sodium chloride 0.9 % 250 mL IVPB  Status:  Discontinued     500 mg 250 mL/hr over 60 Minutes Intravenous Every 24 hours 04/22/18 1531 04/24/18 1213   04/22/18 1545  cefTRIAXone (ROCEPHIN) 1 g in sodium chloride 0.9 % 100 mL IVPB  Status:  Discontinued     1 g 200 mL/hr over 30 Minutes Intravenous Every 24 hours 04/22/18 1531 04/24/18 1934   04/22/18 1400  levofloxacin (LEVAQUIN) IVPB 500 mg     500 mg 100 mL/hr over 60 Minutes Intravenous  Once 04/22/18 1347 04/22/18 1550         Assessment & Plan:  Acute respiratory failure with hypoxia (HCC) Autoimmune and vasculitis negative. Features c/w VAPE lung +/- superimposd bacterial process  - Improved after steroids esp on 04/26/2018 after increased steroids onm9/19/19 - some worse mayb e9/11/19 but correlates with not usin bipap and repeat fever/wbc/posslbe cxr worsening - vanc re-added  - improved again clinically 04/28/18 after vanc readded -> PCT normalized. No micro identified yet  Plan - Continue current dose of steroids -> aim for half of solumedrol 04/28/18 and then over next few days get to prednisone '60mg'$  and from there  3 weeks pred taper - continue vanc and - continue zosyn.  - total 8-10 days -  bipap  - ok for prn in day + but continue QHS for now and gradually taper  - ok to do this in the floor - O2 for piulse ox > 88%    Marijuana user/Vapes THC Reported to [place] [facility]9/10/19  Sepsis ([place]) Sinusitis v pneumonia v both. rpeat fever 04/26/18 with rising wbc 04/27/18  Plan vanc and zosyn per above    Anxiety Xanax at night     Best Practice / Goals  of Care / Disposition.   DVT prophylaxis: heparin SQ GI prophylaxis: pepcid Diet: regular diet Mobility:bed rest -> oob chair and pulm toilet Code Status: full code Family Communication: wife roxanne and patient updated 04/25/18 and patient 04/28/2018    Disposition / Summary of [place] 04/28/2018    Ok to move to floor by [facility], med-surg (non-tele). PCCM will see every few days. Taper off antibiotics after 8-10 days total and see above for steroid taper rec    SIGNATURE    Dr. Brand Males, M.D., F.C.C.P,  Pulmonary and Critical Care Medicine Staff Physician, [place] Director - Interstitial Lung Disease  Program  P[facility] [place] [facility] [place], [place], [address]  Pager: [phone], If no answer or between  15:00h - 7:00h: call [phone] Telephone: [phone]  9:32 AM 04/28/2018

## 2018-04-28 NOTE — Progress Notes (Signed)
PROGRESS NOTE    Eric [name]  QVZ:[id number] DOB: 13-Sep-1985 DOA: 04/22/2018 PCP: Haywood Pao, MD  Brief Narrative:32 y.o.malewith past medical history relevant for bipolar disorder, mutant asthma who presents with 2 to 3 days of cough, fevers, shortness of breath fatigue and malaise, No sick contacts, no recent travels, patient has nausea but no vomiting Additional history obtained from patient's mother at bedside Patient has no prior history of hospitalizations as an adult, no prior history of ED visits for asthma no prior intubations or ICU admissions He was in his usual state of health until 2 to 3 days ago when he started to have respiratory symptoms  Interim history Admitted for sepsis secondary to pneumonia. Patient became hypoxic with respiratory distress, was transferred to stepdown and placed on BiPAP. Patient was she started on azithromycin and ceftriaxone however antibiotics were broadened. Was started on IV Solu-Medrol. PCCM consulted  Assessment & Plan:   Principal Problem:   Acute respiratory failure with hypoxia ([place]) Active Problems:   Mild persistent asthma   Allergic rhinitis   CAP (community acquired pneumonia)/Bilateral   Marijuana user/Vapes THC   Sepsis ([place])   Anxiety  Acute hypoxic respiratory failure due to vape THC use  -Upon admission, patient was found to have hypoxia with a oxygen saturation of 86% on room air and at rest -Echocardiogram shows an EF of 60 to 65%, no regional wall motion abnormalities --PCCM  appreciated -Continue Pulmicort,nebulizer treatments as well as IV steroids,SOLU Medrol 125 mg every 6 HRS and vancomycin and Zosyn for a total of 10 days, taper steroids tomorrow if clinically stable -Pending sputum culture -Respiratory viral panel negative -RF, Sjogren A/B, Anti-scl Ab, glomerular basement negative -CT chest shows bilateral groundglass opacities.  Pneumonia urine antigen negative. -ambulate /PT eval  Bipolar  disorder/depression/anxiety -Increase Xanax twice daily as he was taking at home.  Restart Celexa and Lamictal.  It appears that he takes both Celexa and Zoloft at home.  I will restart Celexa and hold Zoloft.  Will discuss with his wife.   THC abuse -Admits to using THC vaping -Counseled on cessation -Drug screen +THC  Headache  -Toradol, Fioricet as well as Phenergan as needed -possibly be worsened by coughing, nasal congestion -CT headno significant or cranial abnormality    DVT prophylaxis: Heparin Code Status full code Family Communication: No family in the room today Disposition Plan: TBD Consultants PCCM   Procedures:NONE Antimicrobials: VANC/ZOSYN Subjective: Patient resting in bed currently on 4 L of oxygen.  Awake alert able to speak in full sentences.USED BIPAP overnight   Objective: Vitals:   04/28/18 0500 04/28/18 0600 04/28/18 0800 04/28/18 0939  BP: 140/73 139/89    Pulse: (!) 51 65    Resp: (!) 22 (!) 29    Temp:   98.1 F (36.7 C)   TempSrc:   Oral   SpO2: 97% 97%  97%  Weight:  80.7 kg    Height:        Intake/Output Summary (Last 24 hours) at 04/28/2018 1447 Last data filed at 04/28/2018 0100 Gross per 24 hour  Intake 1692.8 ml  Output -  Net 1692.8 ml   Filed Weights   04/25/18 0500 04/26/18 1100 04/28/18 0600  Weight: 80.5 kg 81.3 kg 80.7 kg    Examination:  General exam: Appears calm and comfortable  Respiratory system: Clear to auscultation. Respiratory effort normal. Cardiovascular system: S1 & S2 heard, RRR. No JVD, murmurs, rubs, gallops or clicks. No pedal edema. Gastrointestinal system:  Abdomen is nondistended, soft and nontender. No organomegaly or masses felt. Normal bowel sounds heard. Central nervous system: Alert and oriented. No focal neurological deficits. Extremities: Symmetric 5 x 5 power. Skin: No rashes, lesions or ulcers Psychiatry: Judgement and insight appear normal. Mood & affect appropriate.     Data  Reviewed: I have personally reviewed following labs and imaging studies  CBC: Recent Labs  Lab 04/22/18 1046  04/24/18 0522 04/24/18 2044 04/25/18 0335 04/27/18 0943 04/28/18 0317  WBC 16.4*   < > 12.1* 12.5* 8.3 11.7* 11.7*  NEUTROABS 14.7*  --   --   --   --  10.7* 10.9*  HGB 15.4   < > 12.5* 12.7* 11.6* 12.0* 11.5*  HCT 42.6   < > 36.4* 37.1* 34.2* 35.2* 34.0*  MCV 87.3   < > 88.1 89.4 89.3 88.7 89.0  PLT 320   < > 304 359 329 447* 437*   < > = values in this interval not displayed.   Basic Metabolic Panel: Recent Labs  Lab 04/24/18 0522 04/24/18 2044 04/25/18 0335 04/27/18 0335 04/27/18 0943 04/28/18 0317  NA 140 139 140  --  141 141  K 4.3 4.3 3.8  --  3.7 3.4*  CL 105 104 106  --  103 104  CO2 24 23 23   --  27 27  GLUCOSE 108* 122* 163*  --  125* 134*  BUN 11 9 16   --  13 15  CREATININE 0.78 0.86 0.77 0.73 0.74 0.69  0.67  CALCIUM 9.1 9.4 9.0  --  9.3 8.9  MG  --   --  2.4  --  2.1 2.2  PHOS  --   --  2.6  --  3.7 3.4   GFR: Estimated Creatinine Clearance: 138.6 mL/min (by C-G formula based on SCr of 0.67 mg/dL). Liver Function Tests: Recent Labs  Lab 04/22/18 1046 04/24/18 2044 04/28/18 0317  AST 22 24 35  ALT 16 15 44  ALKPHOS 107 87 73  BILITOT 2.0* 0.8 0.5  PROT 8.9* 7.5 6.1*  ALBUMIN 4.2 2.8* 2.3*   No results for input(s): LIPASE, AMYLASE in the last 168 hours. No results for input(s): AMMONIA in the last 168 hours. Coagulation Profile: No results for input(s): INR, PROTIME in the last 168 hours. Cardiac Enzymes: No results for input(s): CKTOTAL, CKMB, CKMBINDEX, TROPONINI in the last 168 hours. BNP (last 3 results) No results for input(s): PROBNP in the last 8760 hours. HbA1C: No results for input(s): HGBA1C in the last 72 hours. CBG: Recent Labs  Lab 04/27/18 1921 04/27/18 2358 04/28/18 0427 04/28/18 0814 04/28/18 1243  GLUCAP 130* 123* 128* 155* 151*   Lipid Profile: No results for input(s): CHOL, HDL, LDLCALC, TRIG, CHOLHDL,  LDLDIRECT in the last 72 hours. Thyroid Function Tests: No results for input(s): TSH, T4TOTAL, FREET4, T3FREE, THYROIDAB in the last 72 hours. Anemia Panel: No results for input(s): VITAMINB12, FOLATE, FERRITIN, TIBC, IRON, RETICCTPCT in the last 72 hours. Sepsis Labs: Recent Labs  Lab 04/22/18 1107  04/24/18 2044 04/24/18 2351 04/25/18 0335 04/26/18 0311 04/27/18 0943 04/28/18 0317  PROCALCITON  --    < > 1.69  --  1.28 0.82 0.22 <0.10  LATICACIDVEN 1.27  --  1.8 1.5  --   --  1.3  --    < > = values in this interval not displayed.    Recent Results (from the past 240 hour(s))  Culture, blood (Routine X 2) w Reflex to ID Panel  Status: None   Collection Time: 04/22/18  4:24 PM  Result Value Ref Range Status   Specimen Description   Final    RIGHT ANTECUBITAL Performed at [facility] [address], [place], [place] [address]    Special Requests   Final    BOTTLES DRAWN AEROBIC AND ANAEROBIC Blood Culture adequate volume Performed at [facility] [address] [place], [place] [address]    Culture   Final    NO GROWTH 5 DAYS Performed at [facility], [place] [address] [place], [place] [address]    Report Status 04/27/2018 FINAL  Final  MRSA PCR Screening     Status: Abnormal   Collection Time: 04/24/18  7:52 PM  Result Value Ref Range Status   MRSA by PCR POSITIVE (A) NEGATIVE Final    Comment:        The GeneXpert MRSA Assay (FDA approved for NASAL specimens only), is one component of a comprehensive MRSA colonization surveillance program. It is not intended to diagnose MRSA infection nor to guide or monitor treatment for MRSA infections. RESULT CALLED TO, READ BACK BY AND VERIFIED WITH: KALLAM AT 2351 ON 04/24/2018 BY MOSLEY,J Performed at [facility], [place] [address] [place], [place] [address]   Culture, sputum-assessment     Status: None   Collection Time: 04/24/18  8:23 PM  Result Value  Ref Range Status   Specimen Description EXPECTORATED SPUTUM  Final   Special Requests NONE  Final   Sputum evaluation   Final    Sputum specimen not acceptable for testing.  Please recollect.    NOTIFIED K,WASHIGTON AT 2225 ON 04/24/18 BY A,MOHAMED Performed at [facility], [place] [address], [place], [place] [address]    Report Status 04/24/2018 FINAL  Final  Respiratory Panel by PCR     Status: None   Collection Time: 04/24/18  8:23 PM  Result Value Ref Range Status   Adenovirus NOT DETECTED NOT DETECTED Final   Coronavirus 229E NOT DETECTED NOT DETECTED Final   Coronavirus HKU1 NOT DETECTED NOT DETECTED Final   Coronavirus NL63 NOT DETECTED NOT DETECTED Final   Coronavirus OC43 NOT DETECTED NOT DETECTED Final   Metapneumovirus NOT DETECTED NOT DETECTED Final   Rhinovirus / Enterovirus NOT DETECTED NOT DETECTED Final   Influenza A NOT DETECTED NOT DETECTED Final   Influenza B NOT DETECTED NOT DETECTED Final   Parainfluenza Virus 1 NOT DETECTED NOT DETECTED Final   Parainfluenza Virus 2 NOT DETECTED NOT DETECTED Final   Parainfluenza Virus 3 NOT DETECTED NOT DETECTED Final   Parainfluenza Virus 4 NOT DETECTED NOT DETECTED Final   Respiratory Syncytial Virus NOT DETECTED NOT DETECTED Final   Bordetella pertussis NOT DETECTED NOT DETECTED Final   Chlamydophila pneumoniae NOT DETECTED NOT DETECTED Final   Mycoplasma pneumoniae NOT DETECTED NOT DETECTED Final    Comment: Performed at [facility], [place] [address] [place], [place] [address]  Culture, blood (routine x 2) Call MD if unable to obtain prior to antibiotics being given     Status: None (Preliminary result)   Collection Time: 04/24/18  8:44 PM  Result Value Ref Range Status   Specimen Description BLOOD RIGHT HAND  Final   Special Requests   Final    BOTTLES DRAWN AEROBIC ONLY Blood Culture adequate volume Performed at [facility] [address], [place],  [address]    Culture    Final  NO GROWTH 3 DAYS Performed at [facility], [place] [address], [place], [place] [address]    Report Status PENDING  Incomplete  Culture, blood (routine x 2) Call MD if unable to obtain prior to antibiotics being given     Status: None (Preliminary result)   Collection Time: 04/24/18  8:44 PM  Result Value Ref Range Status   Specimen Description   Final    BLOOD RIGHT ANTECUBITAL Performed at [facility] [address] [place], [place] [address]    Special Requests   Final    BOTTLES DRAWN AEROBIC ONLY Blood Culture adequate volume Performed at [facility] [address] [place], [place] [address]    Culture   Final    NO GROWTH 3 DAYS Performed at [facility], [place] [address] [place],  [address]    Report Status PENDING  Incomplete  Expectorated sputum assessment w rflx to resp cult     Status: None   Collection Time: 04/25/18 10:55 AM  Result Value Ref Range Status   Specimen Description SPU EXPECTORATED  Final   Special Requests Normal  Final   Sputum evaluation   Final    Sputum specimen not acceptable for testing.  Please recollect.   RESULTS CALLED TO [facility] [id number] @ [place] Performed at [facility] [address] [place],  [address]    Report Status 04/25/2018 FINAL  Final         Radiology Studies: Dg Chest Port 1 View  Result Date: 04/27/2018 CLINICAL DATA:  Cough, fever, shortness of breath. EXAM: PORTABLE CHEST 1 VIEW COMPARISON:  04/25/2018 FINDINGS: Patchy bilateral airspace disease again noted with lower lobe predominance, as well as interstitial prominence. Slightly worsened since prior study. Heart is upper limits normal in size. No visible significant effusions or acute bony abnormality. IMPRESSION: Slight interval worsening in patchy bilateral lower lobe airspace opacities and interstitial prominence within the lungs. This could reflect  edema or infection. Electronically Signed   By: Rolm Baptise M.D.   On: 04/27/2018 11:30        Scheduled Meds: . ALPRAZolam  0.25 mg Oral BID  . budesonide (PULMICORT) nebulizer solution  0.5 mg Nebulization BID  . Chlorhexidine Gluconate Cloth  6 each Topical Q0600  . citalopram  20 mg Oral Daily  . fluticasone  2 spray Each Nare Daily  . heparin  5,000 Units Subcutaneous Q8H  . insulin aspart  2-6 Units Subcutaneous Q4H  . ipratropium-albuterol  3 mL Nebulization Q6H  . lamoTRIgine  100 mg Oral QHS  . methylPREDNISolone (SOLU-MEDROL) injection  125 mg Intravenous Q6H  . mupirocin ointment  1 application Nasal BID  . sodium chloride flush  3 mL Intravenous Q12H   Continuous Infusions: . sodium chloride 10 mL (04/24/18 2222)  . sodium chloride Stopped (04/27/18 2230)  . sodium chloride    . famotidine (PEPCID) IV Stopped (04/28/18 1141)  . piperacillin-tazobactam (ZOSYN)  IV 3.375 g (04/28/18 1358)  . vancomycin 1,500 mg (04/28/18 1356)     LOS: 6 days     Georgette Shell, MD [facility]  If 7PM-7AM, please contact night-coverage [email or web address] Password [facility] 04/28/2018, 2:47 PM

## 2018-04-28 NOTE — Progress Notes (Signed)
ICU RN called this nurse for report. This RN was tied up in a room with a patient and was going to call RN back when free. This RN called back but ICU RN was unavailable at this time.ICU RN attempted to call back but this RN was tied up with an emergency and other staff members were unable to take report on patient at that time.

## 2018-04-28 NOTE — Plan of Care (Signed)
°  Problem: Coping: °Goal: Level of anxiety will decrease °Outcome: Progressing °  °

## 2018-04-29 LAB — CBC WITH DIFFERENTIAL/PLATELET
BASOS ABS: 0 10*3/uL (ref 0.0–0.1)
BASOS PCT: 0 %
EOS ABS: 0 10*3/uL (ref 0.0–0.7)
EOS PCT: 0 %
HCT: 35.9 % — ABNORMAL LOW (ref 39.0–52.0)
HEMOGLOBIN: 12 g/dL — AB (ref 13.0–17.0)
Lymphocytes Relative: 6 %
Lymphs Abs: 0.8 10*3/uL (ref 0.7–4.0)
MCH: 29.9 pg (ref 26.0–34.0)
MCHC: 33.4 g/dL (ref 30.0–36.0)
MCV: 89.3 fL (ref 78.0–100.0)
MONO ABS: 0.3 10*3/uL (ref 0.1–1.0)
MONOS PCT: 2 %
Neutro Abs: 11.4 10*3/uL — ABNORMAL HIGH (ref 1.7–7.7)
Neutrophils Relative %: 92 %
Platelets: 483 10*3/uL — ABNORMAL HIGH (ref 150–400)
RBC: 4.02 MIL/uL — ABNORMAL LOW (ref 4.22–5.81)
RDW: 12.7 % (ref 11.5–15.5)
WBC: 12.4 10*3/uL — ABNORMAL HIGH (ref 4.0–10.5)

## 2018-04-29 LAB — GLUCOSE, CAPILLARY
GLUCOSE-CAPILLARY: 104 mg/dL — AB (ref 70–99)
GLUCOSE-CAPILLARY: 130 mg/dL — AB (ref 70–99)
GLUCOSE-CAPILLARY: 177 mg/dL — AB (ref 70–99)
Glucose-Capillary: 114 mg/dL — ABNORMAL HIGH (ref 70–99)
Glucose-Capillary: 144 mg/dL — ABNORMAL HIGH (ref 70–99)
Glucose-Capillary: 120 mg/dL — ABNORMAL HIGH (ref 70–99)
Glucose-Capillary: 126 mg/dL — ABNORMAL HIGH (ref 70–99)
Glucose-Capillary: 128 mg/dL — ABNORMAL HIGH (ref 70–99)

## 2018-04-29 LAB — CREATININE, SERUM: Creatinine, Ser: 0.72 mg/dL (ref 0.61–1.24)

## 2018-04-29 LAB — MAGNESIUM: MAGNESIUM: 1.9 mg/dL (ref 1.7–2.4)

## 2018-04-29 LAB — PROCALCITONIN

## 2018-04-29 LAB — PHOSPHORUS: PHOSPHORUS: 4.1 mg/dL (ref 2.5–4.6)

## 2018-04-29 LAB — LEGIONELLA PNEUMOPHILA SEROGP 1 UR AG: L. PNEUMOPHILA SEROGP 1 UR AG: NEGATIVE

## 2018-04-29 MED ORDER — METHYLPREDNISOLONE SODIUM SUCC 125 MG IJ SOLR
60.0000 mg | Freq: Four times a day (QID) | INTRAMUSCULAR | Status: DC
  Administered 2018-04-29 – 2018-05-01 (×8): 60 mg via INTRAVENOUS
  Filled 2018-04-29 (×8): qty 2

## 2018-04-29 MED ORDER — SACCHAROMYCES BOULARDII 250 MG PO CAPS
250.0000 mg | ORAL_CAPSULE | Freq: Two times a day (BID) | ORAL | Status: DC
  Administered 2018-04-29 – 2018-05-01 (×4): 250 mg via ORAL
  Filled 2018-04-29 (×4): qty 1

## 2018-04-29 NOTE — Progress Notes (Signed)
D/w dR Landis Gandy - ok for ccm to sign off 04/29/2018 without seeing. Below followup made for [facility]  -> Vape- Acute Lung Injury  Future Appointments  Date Time Provider [place]  05/13/2018  9:00 AM Martyn Ehrich, NP [facility] None

## 2018-04-29 NOTE — Progress Notes (Signed)
PROGRESS NOTE    Eric [name]  MLY:[id number] DOB: [birth year] DOA: 04/22/2018 PCP: Haywood Pao, MD   Brief Narrative:32 y.o.malewith past medical history relevant for bipolar disorder, mutant asthma who presents with 2 to 3 days of cough, fevers, shortness of breath fatigue and malaise, No sick contacts, no recent travels, patient has nausea but no vomiting Additional history obtained from patient's mother at bedside Patient has no prior history of hospitalizations as an adult, no prior history of ED visits for asthma no prior intubations or ICU admissions He was in his usual state of health until 2 to 3 days ago when he started to have respiratory symptoms  Interim history Admitted for sepsis secondary to pneumonia. Patient became hypoxic with respiratory distress, was transferred to stepdown and placed on BiPAP. Patient was she started on azithromycin and ceftriaxone however antibiotics were broadened. Was started on IV Solu-Medrol. PCCM consulted   Assessment & Plan:   Principal Problem:   Acute respiratory failure with hypoxia ([place]) Active Problems:   Mild persistent asthma   Allergic rhinitis   CAP (community acquired pneumonia)/Bilateral   Marijuana user/Vapes THC   Sepsis ([place])   Anxiety  Acute hypoxic respiratory failuredue to vape THC use -Upon admission, patient was found to have hypoxia with a oxygen saturation of 86% on room air and at rest -Echocardiogram shows an EF of 60 to 65%, no regional wall motion abnormalities --PCCM appreciated -Continue Pulmicort,nebulizer treatments as well as IV steroids, decrease SOLU Medrol60 mg every 6 HRS andvancomycin and Zosyn for a total of 10 days, taper steroids tomorrow if clinically stable -Pending sputum culture -Respiratory viral panel negative -RF, Sjogren A/B, Anti-scl Ab, glomerular basement negative -CT chest shows bilateral groundglass opacities. Pneumonia urine antigen negative. -ambulate  -Patient  desaturated to 86% while ambulating.  Bipolar disorder/depression/anxiety -IncreaseXanaxtwice daily as he was taking at home. Restart Celexa and Lamictal. It appears that he takes both Celexa and Zoloft at home. I will restartCelexa and hold Zoloft. Will discuss with his wife.   THC abuse -Admits to using THC vaping -Counseled on cessation -Drug screen +THC  Headache  -Toradol, Fioricet as well as Phenergan as needed -possibly be worsened by coughing, nasal congestion -CT headno significant or cranial abnormality    DVT prophylaxis:HEPARIN Code Status:FULL Family Communication NONE Disposition Plan: TBD  Consultants:  PCCM Procedures: NONE Antimicrobials: VANC ZOSYN Subjective: Feeling better breathing better ambulated in the room yesterday denies any nausea vomiting had some diarrhea.  Objective: Vitals:   04/29/18 0410 04/29/18 0813 04/29/18 1334 04/29/18 1506  BP: 128/86  (!) 153/93   Pulse: (!) 57  60 64  Resp: 19  (!) 24 16  Temp:   98.3 F (36.8 C)   TempSrc:      SpO2: 97% 94% 95% 92%  Weight:      Height:        Intake/Output Summary (Last 24 hours) at 04/29/2018 1548 Last data filed at 04/29/2018 1224 Gross per 24 hour  Intake 380 ml  Output 2000 ml  Net -1620 ml   Filed Weights   04/25/18 0500 04/26/18 1100 04/28/18 0600  Weight: 80.5 kg 81.3 kg 80.7 kg    Examination:  General exam: Appears calm and comfortable  Respiratory system: COARSE breath sounds auscultation. Respiratory effort normal. Cardiovascular system: S1 & S2 heard, RRR. No JVD, murmurs, rubs, gallops or clicks. No pedal edema. Gastrointestinal system: Abdomen is nondistended, soft and nontender. No organomegaly or masses felt. Normal bowel sounds heard.  Central nervous system: Alert and oriented. No focal neurological deficits. Extremities: Symmetric 5 x 5 power. Skin: No rashes, lesions or ulcers Psychiatry: Judgement and insight appear normal. Mood & affect  appropriate.     Data Reviewed: I have personally reviewed following labs and imaging studies  CBC: Recent Labs  Lab 04/24/18 2044 04/25/18 0335 04/27/18 0943 04/28/18 0317 04/29/18 0522  WBC 12.5* 8.3 11.7* 11.7* 12.4*  NEUTROABS  --   --  10.7* 10.9* 11.4*  HGB 12.7* 11.6* 12.0* 11.5* 12.0*  HCT 37.1* 34.2* 35.2* 34.0* 35.9*  MCV 89.4 89.3 88.7 89.0 89.3  PLT 359 329 447* 437* 789*   Basic Metabolic Panel: Recent Labs  Lab 04/24/18 0522 04/24/18 2044 04/25/18 0335 04/27/18 0335 04/27/18 0943 04/28/18 0317 04/29/18 0522  NA 140 139 140  --  141 141  --   K 4.3 4.3 3.8  --  3.7 3.4*  --   CL 105 104 106  --  103 104  --   CO2 24 23 23   --  27 27  --   GLUCOSE 108* 122* 163*  --  125* 134*  --   BUN 11 9 16   --  13 15  --   CREATININE 0.78 0.86 0.77 0.73 0.74 0.69  0.67 0.72  CALCIUM 9.1 9.4 9.0  --  9.3 8.9  --   MG  --   --  2.4  --  2.1 2.2 1.9  PHOS  --   --  2.6  --  3.7 3.4 4.1   GFR: Estimated Creatinine Clearance: 138.6 mL/min (by C-G formula based on SCr of 0.72 mg/dL). Liver Function Tests: Recent Labs  Lab 04/24/18 2044 04/28/18 0317  AST 24 35  ALT 15 44  ALKPHOS 87 73  BILITOT 0.8 0.5  PROT 7.5 6.1*  ALBUMIN 2.8* 2.3*   No results for input(s): LIPASE, AMYLASE in the last 168 hours. No results for input(s): AMMONIA in the last 168 hours. Coagulation Profile: No results for input(s): INR, PROTIME in the last 168 hours. Cardiac Enzymes: No results for input(s): CKTOTAL, CKMB, CKMBINDEX, TROPONINI in the last 168 hours. BNP (last 3 results) No results for input(s): PROBNP in the last 8760 hours. HbA1C: No results for input(s): HGBA1C in the last 72 hours. CBG: Recent Labs  Lab 04/28/18 2357 04/29/18 0408 04/29/18 0731 04/29/18 1221 04/29/18 1538  GLUCAP 144* 130* 120* 126* 128*   Lipid Profile: No results for input(s): CHOL, HDL, LDLCALC, TRIG, CHOLHDL, LDLDIRECT in the last 72 hours. Thyroid Function Tests: No results for  input(s): TSH, T4TOTAL, FREET4, T3FREE, THYROIDAB in the last 72 hours. Anemia Panel: No results for input(s): VITAMINB12, FOLATE, FERRITIN, TIBC, IRON, RETICCTPCT in the last 72 hours. Sepsis Labs: Recent Labs  Lab 04/24/18 2044 04/24/18 2351  04/26/18 0311 04/27/18 0943 04/28/18 0317 04/29/18 0522  PROCALCITON 1.69  --    < > 0.82 0.22 <0.10 <0.10  LATICACIDVEN 1.8 1.5  --   --  1.3  --   --    < > = values in this interval not displayed.    Recent Results (from the past 240 hour(s))  Culture, blood (Routine X 2) w Reflex to ID Panel     Status: None   Collection Time: 04/22/18  4:24 PM  Result Value Ref Range Status   Specimen Description   Final    RIGHT ANTECUBITAL Performed at [facility] [address] [place], [place] [address]    Special  Requests   Final    BOTTLES DRAWN AEROBIC AND ANAEROBIC Blood Culture adequate volume Performed at [facility] [address], [place], [place] [address]    Culture   Final    NO GROWTH 5 DAYS Performed at [facility], [place] [address], [place], [place] [address]    Report Status 04/27/2018 FINAL  Final  MRSA PCR Screening     Status: Abnormal   Collection Time: 04/24/18  7:52 PM  Result Value Ref Range Status   MRSA by PCR POSITIVE (A) NEGATIVE Final    Comment:        The GeneXpert MRSA Assay (FDA approved for NASAL specimens only), is one component of a comprehensive MRSA colonization surveillance program. It is not intended to diagnose MRSA infection nor to guide or monitor treatment for MRSA infections. RESULT CALLED TO, READ BACK BY AND VERIFIED WITH: KALLAM AT 2351 ON 04/24/2018 BY MOSLEY,J Performed at [facility], [place] [address] [place], [place] [address]   Culture, sputum-assessment     Status: None   Collection Time: 04/24/18  8:23 PM  Result Value Ref Range Status   Specimen Description EXPECTORATED SPUTUM  Final   Special Requests NONE   Final   Sputum evaluation   Final    Sputum specimen not acceptable for testing.  Please recollect.    NOTIFIED K,WASHIGTON AT 2225 ON 04/24/18 BY A,MOHAMED Performed at [facility], [place] [address], [place], [place] [address]    Report Status 04/24/2018 FINAL  Final  Respiratory Panel by PCR     Status: None   Collection Time: 04/24/18  8:23 PM  Result Value Ref Range Status   Adenovirus NOT DETECTED NOT DETECTED Final   Coronavirus 229E NOT DETECTED NOT DETECTED Final   Coronavirus HKU1 NOT DETECTED NOT DETECTED Final   Coronavirus NL63 NOT DETECTED NOT DETECTED Final   Coronavirus OC43 NOT DETECTED NOT DETECTED Final   Metapneumovirus NOT DETECTED NOT DETECTED Final   Rhinovirus / Enterovirus NOT DETECTED NOT DETECTED Final   Influenza A NOT DETECTED NOT DETECTED Final   Influenza B NOT DETECTED NOT DETECTED Final   Parainfluenza Virus 1 NOT DETECTED NOT DETECTED Final   Parainfluenza Virus 2 NOT DETECTED NOT DETECTED Final   Parainfluenza Virus 3 NOT DETECTED NOT DETECTED Final   Parainfluenza Virus 4 NOT DETECTED NOT DETECTED Final   Respiratory Syncytial Virus NOT DETECTED NOT DETECTED Final   Bordetella pertussis NOT DETECTED NOT DETECTED Final   Chlamydophila pneumoniae NOT DETECTED NOT DETECTED Final   Mycoplasma pneumoniae NOT DETECTED NOT DETECTED Final    Comment: Performed at [facility], [place] [address] [place], [place] [address]  Culture, blood (routine x 2) Call MD if unable to obtain prior to antibiotics being given     Status: None (Preliminary result)   Collection Time: 04/24/18  8:44 PM  Result Value Ref Range Status   Specimen Description BLOOD RIGHT HAND  Final   Special Requests   Final    BOTTLES DRAWN AEROBIC ONLY Blood Culture adequate volume Performed at [facility] [address], [place], [place] [address]    Culture   Final    NO GROWTH 4 DAYS Performed at [facility], [place] [address]  [place], [place] [address]    Report Status PENDING  Incomplete  Culture, blood (routine x 2) Call MD if unable to obtain prior to antibiotics being given  Status: None (Preliminary result)   Collection Time: 04/24/18  8:44 PM  Result Value Ref Range Status   Specimen Description   Final    BLOOD RIGHT ANTECUBITAL Performed at [facility] [address], [place], [place] [address]    Special Requests   Final    BOTTLES DRAWN AEROBIC ONLY Blood Culture adequate volume Performed at [facility] [address] [place], [place] [address]    Culture   Final    NO GROWTH 4 DAYS Performed at [facility], [place] [address] [place], [place] [address]    Report Status PENDING  Incomplete  Expectorated sputum assessment w rflx to resp cult     Status: None   Collection Time: 04/25/18 10:55 AM  Result Value Ref Range Status   Specimen Description SPU EXPECTORATED  Final   Special Requests Normal  Final   Sputum evaluation   Final    Sputum specimen not acceptable for testing.  Please recollect.   RESULTS CALLED TO [facility] [address] [id number] @ [place] Performed at [facility] [address] [place], [place] [address]    Report Status 04/25/2018 FINAL  Final         Radiology Studies: No results found.      Scheduled Meds: . ALPRAZolam  0.25 mg Oral BID  . budesonide (PULMICORT) nebulizer solution  0.5 mg Nebulization BID  . Chlorhexidine Gluconate Cloth  6 each Topical Q0600  . citalopram  20 mg Oral Daily  . fluticasone  2 spray Each Nare Daily  . heparin  5,000 Units Subcutaneous Q8H  . insulin aspart  2-6 Units Subcutaneous Q4H  . ipratropium-albuterol  3 mL Nebulization Q6H  . lamoTRIgine  100 mg Oral QHS  . methylPREDNISolone (SOLU-MEDROL) injection  60 mg Intravenous Q6H  . sodium chloride flush  3 mL Intravenous Q12H   Continuous Infusions: . sodium chloride 10 mL (04/24/18 2222)  . sodium  chloride Stopped (04/27/18 2230)  . sodium chloride    . famotidine (PEPCID) IV Stopped (04/29/18 1040)  . piperacillin-tazobactam (ZOSYN)  IV 3.375 g (04/29/18 1416)  . vancomycin 1,500 mg (04/29/18 1417)     LOS: 7 days    Georgette Shell, MD [facility] If 7PM-7AM, please contact night-coverage [email or web address] Password [facility] 04/29/2018, 3:48 PM

## 2018-04-29 NOTE — Progress Notes (Signed)
Pharmacy Antibiotic Note  Eric [name] is a 32 y.o. male admitted on 04/22/2018 with worsening respiratory failure.  Pharmacy has been consulted for vancomycin and Zosyn dosing. Note that vanc was d/c'd 9/10 but resumed on 9/11 for fevers.  04/29/2018  WBC 12.4 (on solu-medrol) Scr 0.72, CrCl > 134mls/min Afebrile    Plan:  Continue Zosyn 3.375g IV Q8H infused over 4hrs.  continue vanc 1500mg  IV q12 - goal AUC 400-500  Monitor clinical course, renal function, cultures as available  De-escalate antibiotics as soon as clinically appropriate    Height: 5\' 11"  (180.3 cm) Weight: 177 lb 14.6 oz (80.7 kg) IBW/kg (Calculated) : 75.3  Temp (24hrs), Avg:98 F (36.7 C), Min:97.9 F (36.6 C), Max:98 F (36.7 C)  Recent Labs  Lab 04/24/18 2044 04/24/18 2351 04/25/18 0335 04/27/18 0335 04/27/18 0943 04/28/18 0317 04/29/18 0522  WBC 12.5*  --  8.3  --  11.7* 11.7* 12.4*  CREATININE 0.86  --  0.77 0.73 0.74 0.69  0.67 0.72  LATICACIDVEN 1.8 1.5  --   --  1.3  --   --     Estimated Creatinine Clearance: 138.6 mL/min (by C-G formula based on SCr of 0.72 mg/dL).    No Known Allergies  Antimicrobials this admission: 9/6 ceftriaxone >> 9/8 9/6 azithromycin >> 9/10 9/8 vancomycin >> 9/10, 9/11 >> 9/8 Zosyn >>   Dose adjustments this admission: ---  Microbiology results:  9/6 BCx: NGTD 9/7 HIV antibody: negative  9/8 Legionella antigen: pending 9/8 MRSA PCR: positive 9/8 sputum: unable to get good sample 9/8 strep pneumo: negative   Thank you for allowing pharmacy to be a part of this patient's care.   Dolly Rias RPh 04/29/2018, 1:24 PM Pager [phone]

## 2018-04-29 NOTE — Progress Notes (Signed)
PT Cancellation Note  Patient Details Name: Eric [name] MRN: [id number] DOB: 1986/07/24   Cancelled Treatment:    Reason Eval/Treat Not Completed: PT screened, no needs identified, will sign off(has been amb with nursing staff)   Kenyon Ana 04/29/2018, 5:38 PM

## 2018-04-29 NOTE — Progress Notes (Signed)
O2 sats dropped to 86% while ambulating on room air.

## 2018-04-30 LAB — CULTURE, BLOOD (ROUTINE X 2)
CULTURE: NO GROWTH
CULTURE: NO GROWTH
SPECIAL REQUESTS: ADEQUATE
Special Requests: ADEQUATE

## 2018-04-30 LAB — BASIC METABOLIC PANEL
Anion gap: 11 (ref 5–15)
BUN: 15 mg/dL (ref 6–20)
CALCIUM: 8.9 mg/dL (ref 8.9–10.3)
CO2: 31 mmol/L (ref 22–32)
CREATININE: 0.84 mg/dL (ref 0.61–1.24)
Chloride: 98 mmol/L (ref 98–111)
GFR calc Af Amer: >60 mL/min (ref >60)
Glucose, Bld: 107 mg/dL — ABNORMAL HIGH (ref 70–99)
POTASSIUM: 3.1 mmol/L — AB (ref 3.5–5.1)
SODIUM: 140 mmol/L (ref 135–145)

## 2018-04-30 LAB — CBC WITH DIFFERENTIAL/PLATELET
BAND NEUTROPHILS: 1 %
BASOS ABS: 0 10*3/uL (ref 0.0–0.1)
Basophils Relative: 0 %
EOS ABS: 0 10*3/uL (ref 0.0–0.7)
Eosinophils Relative: 0 %
HCT: 40.1 % (ref 39.0–52.0)
HEMOGLOBIN: 13.5 g/dL (ref 13.0–17.0)
LYMPHS ABS: 0.6 10*3/uL — AB (ref 0.7–4.0)
LYMPHS PCT: 4 %
MCH: 30.1 pg (ref 26.0–34.0)
MCHC: 33.7 g/dL (ref 30.0–36.0)
MCV: 89.5 fL (ref 78.0–100.0)
MONOS PCT: 2 %
Metamyelocytes Relative: 1 %
Monocytes Absolute: 0.3 10*3/uL (ref 0.1–1.0)
Myelocytes: 1 %
Neutro Abs: 13.1 10*3/uL — ABNORMAL HIGH (ref 1.7–7.7)
Neutrophils Relative %: 91 %
Platelets: 544 10*3/uL — ABNORMAL HIGH (ref 150–400)
RBC: 4.48 MIL/uL (ref 4.22–5.81)
RDW: 12.7 % (ref 11.5–15.5)
WBC: 14 10*3/uL — ABNORMAL HIGH (ref 4.0–10.5)

## 2018-04-30 LAB — GLUCOSE, CAPILLARY
GLUCOSE-CAPILLARY: 180 mg/dL — AB (ref 70–99)
Glucose-Capillary: 108 mg/dL — ABNORMAL HIGH (ref 70–99)
Glucose-Capillary: 101 mg/dL — ABNORMAL HIGH (ref 70–99)
Glucose-Capillary: 109 mg/dL — ABNORMAL HIGH (ref 70–99)
Glucose-Capillary: 184 mg/dL — ABNORMAL HIGH (ref 70–99)
Glucose-Capillary: 155 mg/dL — ABNORMAL HIGH (ref 70–99)
Glucose-Capillary: 99 mg/dL (ref 70–99)

## 2018-04-30 LAB — MAGNESIUM: MAGNESIUM: 2.1 mg/dL (ref 1.7–2.4)

## 2018-04-30 LAB — PHOSPHORUS: PHOSPHORUS: 4.3 mg/dL (ref 2.5–4.6)

## 2018-04-30 MED ORDER — POTASSIUM CHLORIDE CRYS ER 20 MEQ PO TBCR
40.0000 meq | EXTENDED_RELEASE_TABLET | ORAL | Status: AC
  Administered 2018-04-30 (×2): 40 meq via ORAL
  Filled 2018-04-30 (×2): qty 2

## 2018-04-30 MED ORDER — IPRATROPIUM-ALBUTEROL 0.5-2.5 (3) MG/3ML IN SOLN
3.0000 mL | Freq: Three times a day (TID) | RESPIRATORY_TRACT | Status: DC
  Administered 2018-05-01: 3 mL via RESPIRATORY_TRACT
  Filled 2018-04-30: qty 3

## 2018-04-30 NOTE — Progress Notes (Signed)
Patient does not want to wear BIPAP tonight. Patient states he wants to wear oxygen. I encouraged patient to call if he feels like he needs to wear it. RT will continue to monitor.

## 2018-04-30 NOTE — Progress Notes (Addendum)
PROGRESS NOTE    Eric [name]  BJS:[id number] DOB: [birth year] DOA: 04/22/2018 PCP: Haywood Pao, MD  Brief Narrative: 32 y.o.malewith past medical history relevant for bipolar disorder, mutant asthma who presents with 2 to 3 days of cough, fevers, shortness of breath fatigue and malaise, No sick contacts, no recent travels, patient has nausea but no vomiting Additional history obtained from patient's mother at bedside Patient has no prior history of hospitalizations as an adult, no prior history of ED visits for asthma no prior intubations or ICU admissions He was in his usual state of health until 2 to 3 days ago when he started to have respiratory symptoms  Interim history Admitted for sepsis secondary to pneumonia. Patient became hypoxic with respiratory distress, was transferred to stepdown and placed on BiPAP. Patient was she started on azithromycin and ceftriaxone however antibiotics were broadened. Was started on IV Solu-Medrol. PCCM consulted    Assessment & Plan:   Principal Problem:   Acute respiratory failure with hypoxia ([place]) Active Problems:   Mild persistent asthma   Allergic rhinitis   CAP (community acquired pneumonia)/Bilateral   Marijuana user/Vapes THC   Sepsis ([place])   Anxiety  Acute hypoxic respiratory failuredue to vape THC use -Upon admission, patient was found to have hypoxia with a oxygen saturation of 86% on room air and at rest -Echocardiogram shows an EF of 60 to 65%, no regional wall motion abnormalities --PCCM appreciated -Continue Pulmicort,nebulizer treatments as well as IV steroids, decrease SOLUMedrol60 mg every 6 HRSandvancomycin and Zosynfor a total of 10 days - sputum culture NO GROWTH -Respiratory viral panel negative -RF, Sjogren A/B, Anti-scl Ab, glomerular basement negative -CT chest shows bilateral groundglass opacities. Pneumonia urine antigen negative. -ambulate  -Patient desaturated to 86% while  ambulating.  HYPOKALEMIA secondary to diarrhea replace and recheck  Antibiotic associated diarrhea started on probiotics  Bipolar disorder/depression/anxiety -IncreaseXanaxtwice daily as he was taking at home. Restart Celexa and Lamictal. It appears that he takes both Celexa and Zoloft at home. I will restartCelexa and hold Zoloft.  THC abuse -Admits to using THC vaping -Counseled on cessation -Drug screen +THC  Headache RESOLVED -Toradol, Fioricet as well as Phenergan as needed -possibly be worsened by coughing, nasal congestion -CT headno significant or cranial abnormality   DVT prophylaxis:HEPARIN Code Status: FULL Family Communication: NONE Disposition Plan:TBD Consultants: PCCM Procedures:NONE Antimicrobials VANC ZOSYN Subjective: Feels better with oxygen and IV steroids still desaturated to 86% on room air while ambulating yesterday.  Bowel movements are still loose but starting to form. Objective: Vitals:   04/30/18 0142 04/30/18 0354 04/30/18 0600 04/30/18 0836  BP:  (!) 142/93    Pulse:  (!) 52    Resp:  20    Temp:  (!) 97.2 F (36.2 C) 98.8 F (37.1 C)   TempSrc:   Oral   SpO2: 98% 99%  94%  Weight:      Height:        Intake/Output Summary (Last 24 hours) at 04/30/2018 1120 Last data filed at 04/30/2018 1000 Gross per 24 hour  Intake 700 ml  Output 2325 ml  Net -1625 ml   Filed Weights   04/26/18 1100 04/28/18 0600 04/29/18 1700  Weight: 81.3 kg 80.7 kg 84.9 kg    Examination:  General exam: Appears calm and comfortable  Respiratory system: Clear to auscultation. Respiratory effort normal. Cardiovascular system: S1 & S2 heard, RRR. No JVD, murmurs, rubs, gallops or clicks. No pedal edema. Gastrointestinal system: Abdomen is nondistended, soft  and nontender. No organomegaly or masses felt. Normal bowel sounds heard. Central nervous system: Alert and oriented. No focal neurological deficits. Extremities: Symmetric 5 x 5 power. Skin: No  rashes, lesions or ulcers Psychiatry: Judgement and insight appear normal. Mood & affect appropriate.     Data Reviewed: I have personally reviewed following labs and imaging studies  CBC: Recent Labs  Lab 04/25/18 0335 04/27/18 0943 04/28/18 0317 04/29/18 0522 04/30/18 0514  WBC 8.3 11.7* 11.7* 12.4* 14.0*  NEUTROABS  --  10.7* 10.9* 11.4* 13.1*  HGB 11.6* 12.0* 11.5* 12.0* 13.5  HCT 34.2* 35.2* 34.0* 35.9* 40.1  MCV 89.3 88.7 89.0 89.3 89.5  PLT 329 447* 437* 483* 160*   Basic Metabolic Panel: Recent Labs  Lab 04/24/18 2044 04/25/18 0335 04/27/18 0335 04/27/18 0943 04/28/18 0317 04/29/18 0522 04/30/18 0514  NA 139 140  --  141 141  --  140  K 4.3 3.8  --  3.7 3.4*  --  3.1*  CL 104 106  --  103 104  --  98  CO2 23 23  --  27 27  --  31  GLUCOSE 122* 163*  --  125* 134*  --  107*  BUN 9 16  --  13 15  --  15  CREATININE 0.86 0.77 0.73 0.74 0.69  0.67 0.72 0.84  CALCIUM 9.4 9.0  --  9.3 8.9  --  8.9  MG  --  2.4  --  2.1 2.2 1.9 2.1  PHOS  --  2.6  --  3.7 3.4 4.1 4.3   GFR: Estimated Creatinine Clearance: 132 mL/min (by C-G formula based on SCr of 0.84 mg/dL). Liver Function Tests: Recent Labs  Lab 04/24/18 2044 04/28/18 0317  AST 24 35  ALT 15 44  ALKPHOS 87 73  BILITOT 0.8 0.5  PROT 7.5 6.1*  ALBUMIN 2.8* 2.3*   No results for input(s): LIPASE, AMYLASE in the last 168 hours. No results for input(s): AMMONIA in the last 168 hours. Coagulation Profile: No results for input(s): INR, PROTIME in the last 168 hours. Cardiac Enzymes: No results for input(s): CKTOTAL, CKMB, CKMBINDEX, TROPONINI in the last 168 hours. BNP (last 3 results) No results for input(s): PROBNP in the last 8760 hours. HbA1C: No results for input(s): HGBA1C in the last 72 hours. CBG: Recent Labs  Lab 04/29/18 1538 04/29/18 2028 04/29/18 2342 04/30/18 0351 04/30/18 0754  GLUCAP 128* 177* 104* 108* 101*   Lipid Profile: No results for input(s): CHOL, HDL, LDLCALC, TRIG,  CHOLHDL, LDLDIRECT in the last 72 hours. Thyroid Function Tests: No results for input(s): TSH, T4TOTAL, FREET4, T3FREE, THYROIDAB in the last 72 hours. Anemia Panel: No results for input(s): VITAMINB12, FOLATE, FERRITIN, TIBC, IRON, RETICCTPCT in the last 72 hours. Sepsis Labs: Recent Labs  Lab 04/24/18 2044 04/24/18 2351  04/26/18 0311 04/27/18 0943 04/28/18 0317 04/29/18 0522  PROCALCITON 1.69  --    < > 0.82 0.22 <0.10 <0.10  LATICACIDVEN 1.8 1.5  --   --  1.3  --   --    < > = values in this interval not displayed.    Recent Results (from the past 240 hour(s))  Culture, blood (Routine X 2) w Reflex to ID Panel     Status: None   Collection Time: 04/22/18  4:24 PM  Result Value Ref Range Status   Specimen Description   Final    RIGHT ANTECUBITAL Performed at [facility] [address] [place], [place]  [address]    Special Requests   Final    BOTTLES DRAWN AEROBIC AND ANAEROBIC Blood Culture adequate volume Performed at [facility] [address], [place], [place] [address]    Culture   Final    NO GROWTH 5 DAYS Performed at [facility], [place] [address] [place], [place] [address]    Report Status 04/27/2018 FINAL  Final  MRSA PCR Screening     Status: Abnormal   Collection Time: 04/24/18  7:52 PM  Result Value Ref Range Status   MRSA by PCR POSITIVE (A) NEGATIVE Final    Comment:        The GeneXpert MRSA Assay (FDA approved for NASAL specimens only), is one component of a comprehensive MRSA colonization surveillance program. It is not intended to diagnose MRSA infection nor to guide or monitor treatment for MRSA infections. RESULT CALLED TO, READ BACK BY AND VERIFIED WITH: KALLAM AT 2351 ON 04/24/2018 BY MOSLEY,J Performed at [facility], [place] [address] [place], [place] [address]   Culture, sputum-assessment     Status: None   Collection Time: 04/24/18  8:23 PM  Result Value Ref Range  Status   Specimen Description EXPECTORATED SPUTUM  Final   Special Requests NONE  Final   Sputum evaluation   Final    Sputum specimen not acceptable for testing.  Please recollect.    NOTIFIED K,WASHIGTON AT 2225 ON 04/24/18 BY A,MOHAMED Performed at [facility], [place] [address] [place], [place] [address]    Report Status 04/24/2018 FINAL  Final  Respiratory Panel by PCR     Status: None   Collection Time: 04/24/18  8:23 PM  Result Value Ref Range Status   Adenovirus NOT DETECTED NOT DETECTED Final   Coronavirus 229E NOT DETECTED NOT DETECTED Final   Coronavirus HKU1 NOT DETECTED NOT DETECTED Final   Coronavirus NL63 NOT DETECTED NOT DETECTED Final   Coronavirus OC43 NOT DETECTED NOT DETECTED Final   Metapneumovirus NOT DETECTED NOT DETECTED Final   Rhinovirus / Enterovirus NOT DETECTED NOT DETECTED Final   Influenza A NOT DETECTED NOT DETECTED Final   Influenza B NOT DETECTED NOT DETECTED Final   Parainfluenza Virus 1 NOT DETECTED NOT DETECTED Final   Parainfluenza Virus 2 NOT DETECTED NOT DETECTED Final   Parainfluenza Virus 3 NOT DETECTED NOT DETECTED Final   Parainfluenza Virus 4 NOT DETECTED NOT DETECTED Final   Respiratory Syncytial Virus NOT DETECTED NOT DETECTED Final   Bordetella pertussis NOT DETECTED NOT DETECTED Final   Chlamydophila pneumoniae NOT DETECTED NOT DETECTED Final   Mycoplasma pneumoniae NOT DETECTED NOT DETECTED Final    Comment: Performed at [facility], [place] [address] [place], [place] [address]  Culture, blood (routine x 2) Call MD if unable to obtain prior to antibiotics being given     Status: None   Collection Time: 04/24/18  8:44 PM  Result Value Ref Range Status   Specimen Description BLOOD RIGHT HAND  Final   Special Requests   Final    BOTTLES DRAWN AEROBIC ONLY Blood Culture adequate volume Performed at [facility] [address] [place], [place] [address]    Culture   Final    NO GROWTH 5  DAYS Performed at [facility], [place] [address], [place], [place] [address]    Report Status 04/30/2018 FINAL  Final  Culture, blood (routine x 2) Call MD if unable to obtain prior to antibiotics  being given     Status: None   Collection Time: 04/24/18  8:44 PM  Result Value Ref Range Status   Specimen Description   Final    BLOOD RIGHT ANTECUBITAL Performed at [facility] [address], [place], [place] [address]    Special Requests   Final    BOTTLES DRAWN AEROBIC ONLY Blood Culture adequate volume Performed at [facility] [address], [place], [place] [address]    Culture   Final    NO GROWTH 5 DAYS Performed at [facility], [place] [address], [place], [place] [address]    Report Status 04/30/2018 FINAL  Final  Expectorated sputum assessment w rflx to resp cult     Status: None   Collection Time: 04/25/18 10:55 AM  Result Value Ref Range Status   Specimen Description SPU EXPECTORATED  Final   Special Requests Normal  Final   Sputum evaluation   Final    Sputum specimen not acceptable for testing.  Please recollect.   RESULTS CALLED TO [facility] [phone] @ [place] Performed at [facility] [address] [place], [place] [address]    Report Status 04/25/2018 FINAL  Final         Radiology Studies: No results found.      Scheduled Meds: . ALPRAZolam  0.25 mg Oral BID  . budesonide (PULMICORT) nebulizer solution  0.5 mg Nebulization BID  . citalopram  20 mg Oral Daily  . fluticasone  2 spray Each Nare Daily  . heparin  5,000 Units Subcutaneous Q8H  . insulin aspart  2-6 Units Subcutaneous Q4H  . ipratropium-albuterol  3 mL Nebulization Q6H  . lamoTRIgine  100 mg Oral QHS  . methylPREDNISolone (SOLU-MEDROL) injection  60 mg Intravenous Q6H  . potassium chloride  40 mEq Oral Q2H  . saccharomyces boulardii  250 mg Oral BID  . sodium chloride flush  3 mL Intravenous Q12H    Continuous Infusions: . sodium chloride 10 mL (04/24/18 2222)  . sodium chloride Stopped (04/27/18 2230)  . sodium chloride    . famotidine (PEPCID) IV Stopped (04/30/18 0957)  . piperacillin-tazobactam (ZOSYN)  IV 3.375 g (04/30/18 0559)  . vancomycin 1,500 mg (04/30/18 0207)     LOS: 8 days     Georgette Shell, MD [facility] If 7PM-7AM, please contact night-coverage [email or web address] Password [email or web address] 04/30/2018, 11:20 AM

## 2018-05-01 LAB — CBC WITH DIFFERENTIAL/PLATELET
BASOS PCT: 0 %
Basophils Absolute: 0 10*3/uL (ref 0.0–0.1)
EOS PCT: 1 %
Eosinophils Absolute: 0.1 10*3/uL (ref 0.0–0.7)
HEMATOCRIT: 40.8 % (ref 39.0–52.0)
Hemoglobin: 13.6 g/dL (ref 13.0–17.0)
LYMPHS PCT: 7 %
Lymphs Abs: 1 10*3/uL (ref 0.7–4.0)
MCH: 30.1 pg (ref 26.0–34.0)
MCHC: 33.3 g/dL (ref 30.0–36.0)
MCV: 90.3 fL (ref 78.0–100.0)
Monocytes Absolute: 0.6 10*3/uL (ref 0.1–1.0)
Monocytes Relative: 4 %
Neutro Abs: 12.6 10*3/uL — ABNORMAL HIGH (ref 1.7–7.7)
Neutrophils Relative %: 88 %
PLATELETS: 522 10*3/uL — AB (ref 150–400)
RBC: 4.52 MIL/uL (ref 4.22–5.81)
RDW: 12.8 % (ref 11.5–15.5)
WBC MORPHOLOGY: INCREASED
WBC: 14.3 10*3/uL — AB (ref 4.0–10.5)

## 2018-05-01 LAB — PHOSPHORUS: Phosphorus: 3.7 mg/dL (ref 2.5–4.6)

## 2018-05-01 LAB — GLUCOSE, CAPILLARY
GLUCOSE-CAPILLARY: 106 mg/dL — AB (ref 70–99)
GLUCOSE-CAPILLARY: 153 mg/dL — AB (ref 70–99)
Glucose-Capillary: 94 mg/dL (ref 70–99)

## 2018-05-01 LAB — CREATININE, SERUM
Creatinine, Ser: 0.8 mg/dL (ref 0.61–1.24)
GFR calc Af Amer: >60 mL/min (ref >60)
GFR calc non Af Amer: >60 mL/min (ref >60)

## 2018-05-01 MED ORDER — SACCHAROMYCES BOULARDII 250 MG PO CAPS: 250.0000 mg | ORAL_CAPSULE | Freq: Two times a day (BID) | ORAL | Status: DC

## 2018-05-01 MED ORDER — FAMOTIDINE 20 MG PO TABS: 20.0000 mg | ORAL_TABLET | Freq: Two times a day (BID) | ORAL | 1 refills | Status: DC

## 2018-05-01 MED ORDER — PREDNISONE 10 MG PO TABS: ORAL_TABLET | ORAL | 0 refills | Status: DC

## 2018-05-01 MED ORDER — FAMOTIDINE 20 MG PO TABS: 20.0000 mg | ORAL_TABLET | Freq: Two times a day (BID) | ORAL | Status: DC

## 2018-05-01 MED ORDER — IPRATROPIUM-ALBUTEROL 0.5-2.5 (3) MG/3ML IN SOLN: 3.0000 mL | Freq: Three times a day (TID) | RESPIRATORY_TRACT | 1 refills | Status: DC

## 2018-05-01 MED ORDER — BUDESONIDE 0.5 MG/2ML IN SUSP: 0.5000 mg | Freq: Two times a day (BID) | RESPIRATORY_TRACT | 0 refills | Status: DC

## 2018-05-01 MED ORDER — ALBUTEROL SULFATE (2.5 MG/3ML) 0.083% IN NEBU: 2.5000 mg | INHALATION_SOLUTION | RESPIRATORY_TRACT | 12 refills | Status: DC | PRN

## 2018-05-01 MED ORDER — FLUTICASONE PROPIONATE 50 MCG/ACT NA SUSP: 2.0000 | Freq: Every day | NASAL | 2 refills | Status: DC

## 2018-05-01 NOTE — Discharge Instructions (Signed)
Please follow-up with primary care physician pulmonologist prior to going back to work.

## 2018-05-01 NOTE — Progress Notes (Signed)
NCM spoke to pt and he does not have a neb machine. Contacted [facility] rep with new DME order. Neb machine will be delivered to room prior to dc. Jonnie Finner RN CCM Case Mgmt phone [phone]

## 2018-05-01 NOTE — Progress Notes (Signed)
Discharge instructions and medications discussed with patient and wife. AVS given to patient.  All questions answered.  

## 2018-05-01 NOTE — Progress Notes (Signed)
Pharmacy IV to PO conversion  The patient is receiving Famotidine by the intravenous route.  Based on criteria approved by the Pharmacy and [facility], the medication is being converted to the equivalent oral dose form.   No active GI bleeding or impaired absorption  Not s/p esophagectomy  Documented ability to take oral medications for > 24 hr  Plan to continue treatment for at least 1 day  If you have any questions about this conversion, please contact the Pharmacy Department (ext [phone]).  Thank you.  Reuel Boom, PharmD, BCPS [phone] 05/01/2018, 12:05 PM

## 2018-05-01 NOTE — Discharge Summary (Signed)
Physician Discharge Summary  Eric [name] YNW:[id number] DOB: [birth year] DOA: 04/22/2018  PCP: Haywood Pao, MD  Admit date: 04/22/2018 Discharge date: 05/01/2018  Admitted From: Home Disposition: Home Recommendations for Outpatient Follow-up:  1. Follow up with PCP in 1-2 weeks 2. Please obtain BMP/CBC in one week 3. Follow-up with Dr. Chase Caller in 2 weeks  Home Health: None Equipment/Devices: DME nebulizer Discharge Condition stable CODE STATUS: Full code Diet recommendation: Regular diet  Brief/Interim Summary:32 y.o.malewith past medical history relevant for bipolar disorder, mutant asthma who presents with 2 to 3 days of cough, fevers, shortness of breath fatigue and malaise, No sick contacts, no recent travels, patient has nausea but no vomiting Additional history obtained from patient's mother at bedside Patient has no prior history of hospitalizations as an adult, no prior history of ED visits for asthma no prior intubations or ICU admissions He was in his usual state of health until 2 to 3 days ago when he started to have respiratory symptoms  Interim history Admitted for sepsis secondary to pneumonia. Patient became hypoxic with respiratory distress, was transferred to stepdown and placed on BiPAP. Patient was she started on azithromycin and ceftriaxone however antibiotics were broadened. Was started on IV Solu-Medrol. PCCM consulted   Discharge Diagnoses:  Principal Problem:   Acute respiratory failure with hypoxia ([place]) Active Problems:   Mild persistent asthma   Allergic rhinitis   CAP (community acquired pneumonia)/Bilateral   Marijuana user/Vapes THC   Sepsis ([place])   Anxiety   Acute hypoxic respiratory failuredue to vape THC use -Upon admission, patient was found to have hypoxia with a oxygen saturation of 86% on room air and at rest -Echocardiogram shows an EF of 60 to 65%, no regional wall motion abnormalities -Patient was seen in consultation by  PCCM -He was treated with vancomycin and Zosyn and Pulmicort, duo nebs and albuterol and IV Solu-Medrol.- sputum culture NO GROWTH -Respiratory viral panel negative -RF, Sjogren A/B, Anti-scl Ab, glomerular basement negative -CT chest shows bilateral groundglass opacities. Pneumonia urine antigen negative. -ambulate -Patient on the day of discharge without desaturation holding his sats above 92%.Marland Kitchen  HYPOKALEMIA  resolved.  Antibiotic associated diarrhea started on probiotics  Bipolar disorder/depression/anxiety continue his home medications including lamotrigine and sertraline.  He is reported he does not take take Celexa at home will DC Celexa.   THC abuse -Admits to using THC vaping -Counseled on cessation -Drug screen [facility]   Discharge Instructions  Discharge Instructions    Call MD for:  difficulty breathing, headache or visual disturbances   Complete by:  As directed    Call MD for:  persistant nausea and vomiting   Complete by:  As directed    Call MD for:  severe uncontrolled pain   Complete by:  As directed    Call MD for:  temperature >100.4   Complete by:  As directed    DME Nebulizer machine   Complete by:  As directed    Patient needs a nebulizer to treat with the following condition:  Bronchitis   DME Nebulizer/meds   Complete by:  As directed    Patient needs a nebulizer to treat with the following condition:  Bronchitis   Diet - low sodium heart healthy   Complete by:  As directed    Increase activity slowly   Complete by:  As directed      Allergies as of 05/01/2018   No Known Allergies     Medication List    STOP  taking these medications   ARNUITY ELLIPTA 100 MCG/ACT Aepb Generic drug:  Fluticasone Furoate   citalopram 20 MG tablet Commonly known as:  CELEXA   HYDROcodone-acetaminophen 5-325 MG tablet Commonly known as:  NORCO/VICODIN   levocetirizine 5 MG tablet Commonly known as:  XYZAL   ondansetron 4 MG tablet Commonly known as:   ZOFRAN   triazolam 0.25 MG tablet Commonly known as:  HALCION     TAKE these medications   albuterol (2.5 MG/3ML) 0.083% nebulizer solution Commonly known as:  PROVENTIL Take 3 mLs (2.5 mg total) by nebulization every 2 (two) hours as needed for wheezing.   ALPRAZolam 0.25 MG tablet Commonly known as:  XANAX Take 0.25 mg by mouth daily as needed for anxiety.   budesonide 0.5 MG/2ML nebulizer solution Commonly known as:  PULMICORT Take 2 mLs (0.5 mg total) by nebulization 2 (two) times daily.   cyclobenzaprine 10 MG tablet Commonly known as:  FLEXERIL Take 1 tablet (10 mg total) by mouth 2 (two) times daily as needed for muscle spasms. What changed:  how much to take   famotidine 20 MG tablet Commonly known as:  PEPCID Take 1 tablet (20 mg total) by mouth 2 (two) times daily.   fluticasone 50 MCG/ACT nasal spray Commonly known as:  FLONASE Place 2 sprays into both nostrils daily. Start taking on:  05/02/2018 What changed:    how much to take  how to take this  when to take this  additional instructions   ipratropium-albuterol 0.5-2.5 (3) MG/3ML Soln Commonly known as:  DUONEB Take 3 mLs by nebulization 3 (three) times daily.   lamoTRIgine 100 MG tablet Commonly known as:  LAMICTAL Take 100 mg by mouth at bedtime.   metaxalone 800 MG tablet Commonly known as:  SKELAXIN Take 400-800 mg by mouth 3 (three) times daily as needed for muscle spasms.   predniSONE 10 MG tablet Commonly known as:  DELTASONE Start taking 60 mg that is 6  tablets for the first 3 days taper by 10 mg every 3days to 10 mg daily until done   saccharomyces boulardii 250 MG capsule Commonly known as:  FLORASTOR Take 1 capsule (250 mg total) by mouth 2 (two) times daily.   sertraline 100 MG tablet Commonly known as:  ZOLOFT Take 100 mg by mouth at bedtime.            Durable Medical Equipment  (From admission, onward)         Start     Ordered   05/01/18 1206  For home use only  DME Nebulizer machine  Once    Question:  Patient needs a nebulizer to treat with the following condition  Answer:  Asthma   05/01/18 1206   05/01/18 0000  DME Nebulizer machine    Question:  Patient needs a nebulizer to treat with the following condition  Answer:  Bronchitis   05/01/18 1205   05/01/18 0000  DME Nebulizer/meds    Question:  Patient needs a nebulizer to treat with the following condition  Answer:  Bronchitis   05/01/18 1205         Follow-up Information    Tisovec, Fransico Him, MD Follow up.   Specialty:  Internal Medicine Contact information: [place] [address] [phone]        Brand Males, MD Follow up.   Specialty:  Pulmonary Disease Contact information: [place]  [address] [phone]  No Known Allergies  Consultations:pccm   Procedures/Studies: Dg Chest 2 View  Result Date: 04/22/2018 CLINICAL DATA:  Shortness of breath EXAM: CHEST - 2 VIEW COMPARISON:  None. FINDINGS: There is patchy airspace consolidation in the lower lobes bilaterally, slightly more on the right than on the left. Lungs elsewhere are clear. The heart size and pulmonary vascular normal. No adenopathy. No bone lesions. IMPRESSION: Patchy bibasilar infiltrate, slightly more on the right than on the left, felt to represent pneumonia. Lungs elsewhere clear. No adenopathy evident. Electronically Signed   By: Lowella Grip III M.D.   On: 04/22/2018 13:50   Ct Head Wo Contrast  Result Date: 04/24/2018 CLINICAL DATA:  Pain in the jaw and headache EXAM: CT HEAD WITHOUT CONTRAST CT MAXILLOFACIAL WITHOUT CONTRAST TECHNIQUE: Multidetector CT imaging of the head and maxillofacial structures were performed using the standard protocol without intravenous contrast. Multiplanar CT image reconstructions of the maxillofacial structures were also generated. COMPARISON:  Multiple exams, including brain MRI dated 06/14/2015 FINDINGS: CT HEAD FINDINGS  Brain: The brainstem, cerebellum, cerebral peduncles, thalami, basal ganglia, basilar cisterns, and ventricular system appear within normal limits. No intracranial hemorrhage, mass lesion, or acute CVA. Vascular: Unremarkable Skull: Unremarkable Other: No supplemental non-categorized findings. CT MAXILLOFACIAL FINDINGS Osseous: No facial fracture identified. No significant abnormal periapical lucency in the maxilla or mandible. Orbits: Unremarkable Sinuses: Mild chronic frontal, ethmoid, left sphenoid, and bilateral maxillary sinusitis. Soft tissues: Although the patient's metal fillings partially obscure some of the area around the mandible, and we do not have IV contrast to assess for enhancement, I do not demonstrate a definite abscess along the mandible on today's exam to account for the patient's jaw pain. No significant regional adenopathy. No thickening of the epiglottis. No significant mandibular condylar abnormality. Upper cervical spine unremarkable. IMPRESSION: 1. No significant intracranial abnormality. 2. Mild chronic paranasal sinusitis. No other appreciable facial abnormality. No periapical lucency associated with the teeth. Electronically Signed   By: Van Clines M.D.   On: 04/24/2018 14:02   Ct Angio Chest Pe W Or Wo Contrast  Result Date: 04/24/2018 CLINICAL DATA:  Shortness of breath. Fatigue. Cough and fever. Malaise. Headache. EXAM: CT ANGIOGRAPHY CHEST WITH CONTRAST TECHNIQUE: Multidetector CT imaging of the chest was performed using the standard protocol during bolus administration of intravenous contrast. Multiplanar CT image reconstructions and MIPs were obtained to evaluate the vascular anatomy. CONTRAST:  157mL ISOVUE-370 IOPAMIDOL (ISOVUE-370) INJECTION 76% COMPARISON:  04/22/2018 chest radiograph FINDINGS: Cardiovascular: No large or central pulmonary emboli are identified. The small emboli, particularly at the segmental and subsegmental level, cannot be readily excluded due to  the degree of motion artifact. I am not confident that repeating the study, with additional doses of radiation and contrast and associated risk, would provide a different result with regard to sensitivity and negative predictive value. No acute aortic findings.  Heart size within normal limits. Mediastinum/Nodes: Small mediastinal lymph nodes may be reactive but are not pathologically enlarged. Lungs/Pleura: Bilateral patchy ground-glass opacities are present with confluent airspace opacities dependently in both upper lobes and especially in both lower lobes. Air bronchograms noted in the left lower lobe consolidation regions. Upper Abdomen: Unremarkable Musculoskeletal: Unremarkable Review of the MIP images confirms the above findings. IMPRESSION: 1. No filling defect is identified in the pulmonary arterial tree to suggest pulmonary embolus. Sensitivity for segmental and subsegmental emboli is low due to motion artifact. I do not sense that repeating the exam would improve the motion artifact issue. 2. Regions of symmetric consolidation  in both lower lobes, with some dependent airspace opacity in the upper lobes and with considerable bilateral ground-glass opacities scattered in the lungs with some immediate subpleural sparing. Possibilities include multilobar bacterial pneumonia, atypical pneumonia, or aspiration pneumonia. Entities such as pulmonary edema and pulmonary hemorrhage are considered less likely. Electronically Signed   By: Van Clines M.D.   On: 04/24/2018 13:57   Dg Chest Port 1 View  Result Date: 04/27/2018 CLINICAL DATA:  Cough, fever, shortness of breath. EXAM: PORTABLE CHEST 1 VIEW COMPARISON:  04/25/2018 FINDINGS: Patchy bilateral airspace disease again noted with lower lobe predominance, as well as interstitial prominence. Slightly worsened since prior study. Heart is upper limits normal in size. No visible significant effusions or acute bony abnormality. IMPRESSION: Slight interval  worsening in patchy bilateral lower lobe airspace opacities and interstitial prominence within the lungs. This could reflect edema or infection. Electronically Signed   By: Rolm Baptise M.D.   On: 04/27/2018 11:30   Dg Chest Port 1 View  Result Date: 04/25/2018 CLINICAL DATA:  Respiratory failure. EXAM: PORTABLE CHEST 1 VIEW COMPARISON:  Chest CT 04/24/2018 FINDINGS: Cardiomediastinal silhouette is normal. Mediastinal contours appear intact. There is no evidence of pneumothorax. Patchy bilateral airspace consolidation with lower lobe predominance. Diffuse haziness of the interstitial markings. Osseous structures are without acute abnormality. Soft tissues are grossly normal. IMPRESSION: Patchy bilateral airspace consolidation with lower lobe predominance. Probable superimposed interstitial pulmonary edema. Electronically Signed   By: Fidela Salisbury M.D.   On: 04/25/2018 07:22   Ct Maxillofacial Wo Contrast  Result Date: 04/24/2018 CLINICAL DATA:  Pain in the jaw and headache EXAM: CT HEAD WITHOUT CONTRAST CT MAXILLOFACIAL WITHOUT CONTRAST TECHNIQUE: Multidetector CT imaging of the head and maxillofacial structures were performed using the standard protocol without intravenous contrast. Multiplanar CT image reconstructions of the maxillofacial structures were also generated. COMPARISON:  Multiple exams, including brain MRI dated 06/14/2015 FINDINGS: CT HEAD FINDINGS Brain: The brainstem, cerebellum, cerebral peduncles, thalami, basal ganglia, basilar cisterns, and ventricular system appear within normal limits. No intracranial hemorrhage, mass lesion, or acute CVA. Vascular: Unremarkable Skull: Unremarkable Other: No supplemental non-categorized findings. CT MAXILLOFACIAL FINDINGS Osseous: No facial fracture identified. No significant abnormal periapical lucency in the maxilla or mandible. Orbits: Unremarkable Sinuses: Mild chronic frontal, ethmoid, left sphenoid, and bilateral maxillary sinusitis. Soft  tissues: Although the patient's metal fillings partially obscure some of the area around the mandible, and we do not have IV contrast to assess for enhancement, I do not demonstrate a definite abscess along the mandible on today's exam to account for the patient's jaw pain. No significant regional adenopathy. No thickening of the epiglottis. No significant mandibular condylar abnormality. Upper cervical spine unremarkable. IMPRESSION: 1. No significant intracranial abnormality. 2. Mild chronic paranasal sinusitis. No other appreciable facial abnormality. No periapical lucency associated with the teeth. Electronically Signed   By: Van Clines M.D.   On: 04/24/2018 14:02    (Echo, Carotid, EGD, Colonoscopy, ERCP)    Subjective:   Discharge Exam: Vitals:   05/01/18 1106 05/01/18 1112  BP:    Pulse:    Resp:    Temp:    SpO2: 94% 94%   Vitals:   05/01/18 0546 05/01/18 0819 05/01/18 1106 05/01/18 1112  BP: 125/90     Pulse: 94     Resp: 16     Temp: 98.3 F (36.8 C)     TempSrc: Oral     SpO2: 97% 94% 94% 94%  Weight: 82.1 kg  Height:        General: Pt is alert, awake, not in acute distress Cardiovascular: RRR, S1/S2 +, no rubs, no gallops Respiratory: few scattered rhonchi  bilaterally, no wheezing, no rhonchi Abdominal: Soft, NT, ND, bowel sounds + Extremities: no edema, no cyanosis    The results of significant diagnostics from this hospitalization (including imaging, microbiology, ancillary and laboratory) are listed below for reference.     Microbiology: Recent Results (from the past 240 hour(s))  Culture, blood (Routine X 2) w Reflex to ID Panel     Status: None   Collection Time: 04/22/18  4:24 PM  Result Value Ref Range Status   Specimen Description   Final    RIGHT ANTECUBITAL Performed at [facility] [address], [place], [place] [address]    Special Requests   Final    BOTTLES DRAWN AEROBIC AND ANAEROBIC Blood Culture  adequate volume Performed at [facility] [address] [place], [place] [address]    Culture   Final    NO GROWTH 5 DAYS Performed at [facility], [place] [address] [place], [place] [address]    Report Status 04/27/2018 FINAL  Final  MRSA PCR Screening     Status: Abnormal   Collection Time: 04/24/18  7:52 PM  Result Value Ref Range Status   MRSA by PCR POSITIVE (A) NEGATIVE Final    Comment:        The GeneXpert MRSA Assay (FDA approved for NASAL specimens only), is one component of a comprehensive MRSA colonization surveillance program. It is not intended to diagnose MRSA infection nor to guide or monitor treatment for MRSA infections. RESULT CALLED TO, READ BACK BY AND VERIFIED WITH: KALLAM AT 2351 ON 04/24/2018 BY MOSLEY,J Performed at [facility], [place] [address] [place], [place] [address]   Culture, sputum-assessment     Status: None   Collection Time: 04/24/18  8:23 PM  Result Value Ref Range Status   Specimen Description EXPECTORATED SPUTUM  Final   Special Requests NONE  Final   Sputum evaluation   Final    Sputum specimen not acceptable for testing.  Please recollect.    NOTIFIED K,WASHIGTON AT 2225 ON 04/24/18 BY A,MOHAMED Performed at [facility], [place] [address], [place],  [address]    Report Status 04/24/2018 FINAL  Final  Respiratory Panel by PCR     Status: None   Collection Time: 04/24/18  8:23 PM  Result Value Ref Range Status   Adenovirus NOT DETECTED NOT DETECTED Final   Coronavirus 229E NOT DETECTED NOT DETECTED Final   Coronavirus HKU1 NOT DETECTED NOT DETECTED Final   Coronavirus NL63 NOT DETECTED NOT DETECTED Final   Coronavirus OC43 NOT DETECTED NOT DETECTED Final   Metapneumovirus NOT DETECTED NOT DETECTED Final   Rhinovirus / Enterovirus NOT DETECTED NOT DETECTED Final   Influenza A NOT DETECTED NOT DETECTED Final   Influenza B NOT DETECTED NOT DETECTED Final    Parainfluenza Virus 1 NOT DETECTED NOT DETECTED Final   Parainfluenza Virus 2 NOT DETECTED NOT DETECTED Final   Parainfluenza Virus 3 NOT DETECTED NOT DETECTED Final   Parainfluenza Virus 4 NOT DETECTED NOT DETECTED Final   Respiratory Syncytial Virus NOT DETECTED NOT DETECTED Final   Bordetella pertussis NOT DETECTED NOT DETECTED Final   Chlamydophila pneumoniae NOT DETECTED NOT DETECTED Final   Mycoplasma pneumoniae NOT DETECTED NOT DETECTED Final    Comment: Performed at [facility], [address], [place], [place] [address]  Culture, blood (routine x 2) Call MD if unable to obtain prior to antibiotics being given     Status: None   Collection Time: 04/24/18  8:44 PM  Result Value Ref Range Status   Specimen Description BLOOD RIGHT HAND  Final   Special Requests   Final    BOTTLES DRAWN AEROBIC ONLY Blood Culture adequate volume Performed at [facility] [address] [place], [place] [address]    Culture   Final    NO GROWTH 5 DAYS Performed at [facility], [place] [address] [place], [place] [address]    Report Status 04/30/2018 FINAL  Final  Culture, blood (routine x 2) Call MD if unable to obtain prior to antibiotics being given     Status: None   Collection Time: 04/24/18  8:44 PM  Result Value Ref Range Status   Specimen Description   Final    BLOOD RIGHT ANTECUBITAL Performed at [facility] [address], [place], [place] [address]    Special Requests   Final    BOTTLES DRAWN AEROBIC ONLY Blood Culture adequate volume Performed at [facility] [address], [place], [place] [address]    Culture   Final    NO GROWTH 5 DAYS Performed at [facility], [place] [address] [place], [place] [address]    Report Status 04/30/2018 FINAL  Final  Expectorated sputum assessment w rflx to resp cult     Status: None   Collection Time: 04/25/18 10:55 AM  Result Value Ref Range Status   Specimen  Description SPU EXPECTORATED  Final   Special Requests Normal  Final   Sputum evaluation   Final    Sputum specimen not acceptable for testing.  Please recollect.   RESULTS CALLED TO [facility] [id number] @ [place] Performed at [facility] [address] [place],  [address]    Report Status 04/25/2018 FINAL  Final     Labs: BNP (last 3 results) Recent Labs    04/24/18 2044  BNP 65.6   Basic Metabolic Panel: Recent Labs  Lab 04/24/18 2044  04/25/18 0335  04/27/18 0943 04/28/18 0317 04/29/18 0522 04/30/18 0514 05/01/18 0527  NA 139  --  140  --  141 141  --  140  --   K 4.3  --  3.8  --  3.7 3.4*  --  3.1*  --   CL 104  --  106  --  103 104  --  98  --   CO2 23  --  23  --  27 27  --  31  --   GLUCOSE 122*  --  163*  --  125* 134*  --  107*  --   BUN 9  --  16  --  13 15  --  15  --   CREATININE 0.86  --  0.77   < > 0.74 0.69  0.67 0.72 0.84 0.80  CALCIUM 9.4  --  9.0  --  9.3 8.9  --  8.9  --   MG  --   --  2.4  --  2.1 2.2 1.9 2.1  --   PHOS  --    < > 2.6  --  3.7 3.4 4.1 4.3 3.7   < > = values in this interval not displayed.   Liver Function Tests: Recent Labs  Lab 04/24/18 2044 04/28/18 0317  AST 24 35  ALT 15 44  ALKPHOS 87 73  BILITOT 0.8 0.5  PROT 7.5 6.1*  ALBUMIN 2.8* 2.3*   No results for input(s): LIPASE, AMYLASE in the last 168 hours. No results for input(s): AMMONIA in the last 168 hours. CBC: Recent Labs  Lab 04/27/18 0943 04/28/18 0317 04/29/18 0522 04/30/18 0514 05/01/18 0527  WBC 11.7* 11.7* 12.4* 14.0* 14.3*  NEUTROABS 10.7* 10.9* 11.4* 13.1* 12.6*  HGB 12.0* 11.5* 12.0* 13.5 13.6  HCT 35.2* 34.0* 35.9* 40.1 40.8  MCV 88.7 89.0 89.3 89.5 90.3  PLT 447* 437* 483* 544* 522*   Cardiac Enzymes: No results for input(s): CKTOTAL, CKMB, CKMBINDEX, TROPONINI in the last 168 hours. BNP: Invalid input(s): POCBNP CBG: Recent Labs  Lab 04/30/18 1742 04/30/18 2011 04/30/18 2350 05/01/18 0549  05/01/18 0806  GLUCAP 155* 99 180* 106* 153*   D-Dimer No results for input(s): DDIMER in the last 72 hours. Hgb A1c No results for input(s): HGBA1C in the last 72 hours. Lipid Profile No results for input(s): CHOL, HDL, LDLCALC, TRIG, CHOLHDL, LDLDIRECT in the last 72 hours. Thyroid function studies No results for input(s): TSH, T4TOTAL, T3FREE, THYROIDAB in the last 72 hours.  Invalid input(s): FREET3 Anemia work up No results for input(s): VITAMINB12, FOLATE, FERRITIN, TIBC, IRON, RETICCTPCT in the last 72 hours. Urinalysis    Component Value Date/Time   COLORURINE STRAW (A) 04/24/2018 2332   APPEARANCEUR CLEAR 04/24/2018 2332   LABSPEC 1.005 04/24/2018 2332   PHURINE 7.0 04/24/2018 2332   GLUCOSEU NEGATIVE 04/24/2018 2332   HGBUR SMALL (A) 04/24/2018 2332   BILIRUBINUR NEGATIVE 04/24/2018 2332   KETONESUR NEGATIVE 04/24/2018 2332   PROTEINUR NEGATIVE 04/24/2018 2332   NITRITE NEGATIVE 04/24/2018 2332   LEUKOCYTESUR NEGATIVE 04/24/2018 2332   Sepsis Labs Invalid input(s): PROCALCITONIN,  WBC,  LACTICIDVEN Microbiology Recent Results (from the past 240 hour(s))  Culture, blood (Routine X 2) w Reflex to ID Panel     Status: None   Collection Time: 04/22/18  4:24 PM  Result Value Ref Range Status   Specimen Description   Final    RIGHT ANTECUBITAL Performed at [facility], [place] [address], [place], [place] [address]    Special Requests   Final    BOTTLES DRAWN AEROBIC AND ANAEROBIC Blood Culture adequate volume Performed at [facility] [address] [place], [place] [address]    Culture   Final    NO GROWTH 5 DAYS Performed at [facility], [place] [address], [place], [place] [address]    Report Status 04/27/2018 FINAL  Final  MRSA PCR Screening     Status: Abnormal   Collection Time: 04/24/18  7:52 PM  Result Value Ref Range Status   MRSA by PCR POSITIVE (A) NEGATIVE Final    Comment:        The GeneXpert MRSA Assay  (FDA approved for NASAL specimens only), is one component of a comprehensive MRSA colonization surveillance program. It is not intended to diagnose MRSA infection nor to guide or monitor treatment for MRSA infections. RESULT CALLED TO, READ BACK BY AND VERIFIED WITH: KALLAM AT 2351 ON 04/24/2018 BY MOSLEY,J Performed at [facility], [place] [address] [place], [place] [address]   Culture, sputum-assessment     Status: None   Collection Time: 04/24/18  8:23 PM  Result Value Ref Range Status   Specimen Description EXPECTORATED SPUTUM  Final   Special Requests NONE  Final   Sputum evaluation   Final    Sputum specimen not acceptable for testing.  Please recollect.    NOTIFIED K,WASHIGTON AT 2225 ON 04/24/18 BY A,MOHAMED Performed at [facility], [place] [address], [place], [place] [address]    Report Status 04/24/2018 FINAL  Final  Respiratory Panel by PCR     Status: None   Collection Time: 04/24/18  8:23 PM  Result Value Ref Range Status   Adenovirus NOT DETECTED NOT DETECTED Final   Coronavirus 229E NOT DETECTED NOT DETECTED Final   Coronavirus HKU1 NOT DETECTED NOT DETECTED Final   Coronavirus NL63 NOT DETECTED NOT DETECTED Final   Coronavirus OC43 NOT DETECTED NOT DETECTED Final   Metapneumovirus NOT DETECTED NOT DETECTED Final   Rhinovirus / Enterovirus NOT DETECTED NOT DETECTED Final   Influenza A NOT DETECTED NOT DETECTED Final   Influenza B NOT DETECTED NOT DETECTED Final   Parainfluenza Virus 1 NOT DETECTED NOT DETECTED Final   Parainfluenza Virus 2 NOT DETECTED NOT DETECTED Final   Parainfluenza Virus 3 NOT DETECTED NOT DETECTED Final   Parainfluenza Virus 4 NOT DETECTED NOT DETECTED Final   Respiratory Syncytial Virus NOT DETECTED NOT DETECTED Final   Bordetella pertussis NOT DETECTED NOT DETECTED Final   Chlamydophila pneumoniae NOT DETECTED NOT DETECTED Final   Mycoplasma pneumoniae NOT DETECTED NOT DETECTED Final    Comment:  Performed at [facility], [place] [address] [place], [place] [address]  Culture, blood (routine x 2) Call MD if unable to obtain prior to antibiotics being given     Status: None   Collection Time: 04/24/18  8:44 PM  Result Value Ref Range Status   Specimen Description BLOOD RIGHT HAND  Final   Special Requests   Final    BOTTLES DRAWN AEROBIC ONLY Blood Culture adequate volume Performed at [place] [address], [place], [place] [address]    Culture   Final    NO GROWTH 5 DAYS Performed at [facility], [place] [address] [place], [place] [address]    Report Status 04/30/2018 FINAL  Final  Culture, blood (routine x 2) Call MD if unable to obtain prior to antibiotics being given     Status: None   Collection Time: 04/24/18  8:44 PM  Result Value Ref Range Status   Specimen Description   Final    BLOOD RIGHT ANTECUBITAL Performed at [place] [address] [place], [place] [address]    Special Requests   Final    BOTTLES DRAWN AEROBIC ONLY Blood Culture adequate volume Performed at [place] [address] [place], [place] [address]    Culture   Final    NO GROWTH 5 DAYS Performed at [facility], [place] [address] [place], [place] [address]    Report Status 04/30/2018 FINAL  Final  Expectorated sputum assessment w rflx to resp cult     Status: None   Collection Time: 04/25/18 10:55 AM  Result Value Ref Range Status   Specimen Description SPU EXPECTORATED  Final   Special Requests Normal  Final   Sputum evaluation   Final    Sputum specimen not acceptable for testing.  Please recollect.   RESULTS CALLED TO [facility] [id number] @ [place] Performed at [place] [address] [place], [place] [address]    Report Status 04/25/2018 FINAL  Final     Time coordinating  discharge: 34 minutes  SIGNED:   Georgette Shell, MD  [facility] 05/01/2018, 12:07 PM Pager   If 7PM-7AM, please contact night-coverage [email or web address] Password [email or web address]

## 2018-05-05 DIAGNOSIS — E876 Hypokalemia: Secondary | ICD-10-CM | POA: Diagnosis not present

## 2018-05-05 DIAGNOSIS — J9601 Acute respiratory failure with hypoxia: Secondary | ICD-10-CM | POA: Diagnosis not present

## 2018-05-05 DIAGNOSIS — R197 Diarrhea, unspecified: Secondary | ICD-10-CM | POA: Diagnosis not present

## 2018-05-13 ENCOUNTER — Inpatient Hospital Stay: Payer: Self-pay | Admitting: Primary Care

## 2018-05-16 ENCOUNTER — Ambulatory Visit (INDEPENDENT_AMBULATORY_CARE_PROVIDER_SITE_OTHER): Payer: PRIVATE HEALTH INSURANCE | Admitting: Primary Care

## 2018-05-16 ENCOUNTER — Encounter: Payer: Self-pay | Admitting: Primary Care

## 2018-05-16 ENCOUNTER — Other Ambulatory Visit (INDEPENDENT_AMBULATORY_CARE_PROVIDER_SITE_OTHER): Payer: PRIVATE HEALTH INSURANCE

## 2018-05-16 VITALS — BP 110/80 | HR 99 | Ht 71.0 in | Wt 169.6 lb

## 2018-05-16 DIAGNOSIS — J189 Pneumonia, unspecified organism: Secondary | ICD-10-CM | POA: Diagnosis not present

## 2018-05-16 DIAGNOSIS — F1290 Cannabis use, unspecified, uncomplicated: Secondary | ICD-10-CM | POA: Diagnosis not present

## 2018-05-16 DIAGNOSIS — J9601 Acute respiratory failure with hypoxia: Secondary | ICD-10-CM | POA: Diagnosis not present

## 2018-05-16 DIAGNOSIS — R0602 Shortness of breath: Secondary | ICD-10-CM

## 2018-05-16 LAB — CBC WITH DIFFERENTIAL/PLATELET
BASOS ABS: 0 10*3/uL (ref 0.0–0.1)
Basophils Relative: 1 % (ref 0.0–3.0)
EOS PCT: 5.4 % — AB (ref 0.0–5.0)
Eosinophils Absolute: 0.2 10*3/uL (ref 0.0–0.7)
HCT: 42.5 % (ref 39.0–52.0)
Hemoglobin: 14.6 g/dL (ref 13.0–17.0)
LYMPHS ABS: 1.7 10*3/uL (ref 0.7–4.0)
Lymphocytes Relative: 40 % (ref 12.0–46.0)
MCHC: 34.3 g/dL (ref 30.0–36.0)
MCV: 88 fl (ref 78.0–100.0)
MONOS PCT: 9.8 % (ref 3.0–12.0)
Monocytes Absolute: 0.4 10*3/uL (ref 0.1–1.0)
NEUTROS ABS: 1.8 10*3/uL (ref 1.4–7.7)
Neutrophils Relative %: 43.8 % (ref 43.0–77.0)
PLATELETS: 241 10*3/uL (ref 150.0–400.0)
RBC: 4.83 Mil/uL (ref 4.22–5.81)
RDW: 13.6 % (ref 11.5–15.5)
WBC: 4.1 10*3/uL (ref 4.0–10.5)

## 2018-05-16 LAB — BASIC METABOLIC PANEL
BUN: 16 mg/dL (ref 6–23)
CALCIUM: 9.3 mg/dL (ref 8.4–10.5)
CO2: 29 mEq/L (ref 19–32)
CREATININE: 0.93 mg/dL (ref 0.40–1.50)
Chloride: 104 mEq/L (ref 96–112)
GFR: 98.46 mL/min (ref >60.00)
Glucose, Bld: 111 mg/dL — ABNORMAL HIGH (ref 70–99)
Potassium: 3.6 mEq/L (ref 3.5–5.1)
Sodium: 141 mEq/L (ref 135–145)

## 2018-05-16 MED ORDER — BUDESONIDE 90 MCG/ACT IN AEPB: 2.0000 | INHALATION_SPRAY | Freq: Two times a day (BID) | RESPIRATORY_TRACT | 6 refills | Status: AC

## 2018-05-16 MED ORDER — ALBUTEROL SULFATE HFA 108 (90 BASE) MCG/ACT IN AERS: 2.0000 | INHALATION_SPRAY | Freq: Four times a day (QID) | RESPIRATORY_TRACT | 2 refills | Status: AC | PRN

## 2018-05-16 NOTE — Progress Notes (Signed)
@Patient  ID: Eric [name], male    DOB: [birth year], 32 y.o.   MRN: [id number]  Chief Complaint  Patient presents with  . Hospitalization Follow-up    States he feels much better since hospital visit. States he was having increased SOB for 5 days and went to ER. Completed pred taper., States over he weekend he had a SOb episode and his BP was elevated.    Referring provider: Haywood Pao, MD  HPI: 32 year old male, former smoker (quit 2006). PMH asthma, bipolar disorder. Patient of Dr. Chase Caller, seen in hospital for initial consult. Patient admitted from 09/06-09/15 for acute respiratory failure with hypoxia, CAP and asthma.   Hospital course: Patient presented to ED with cough, fever, sob and fatigue x2-3 days. No prior hospitalizations. Admitted to using THC vaping. Drug screen positive for THC. O2 saturation on admission was 86% RA. CT showed bilateral groundglass opacities. ECHO showed EF 60-65%. Admitted for sepsis secondary to pneumonia. Transferred to stepdown and placed on BiPAP. Started on Azithromycin, ceftriaxone and solumedrol. Antibiotics were broadended and treated with vancomycin, Zosyn. Received Pulmicort, duo nebs and Albuterol. Sputum culture negative. Respiratory panel negative. Discharged with O2 sat holding above 92%. Needs DME nebulizer.   05/16/2018 Patient presents today for hospital follow up. States that he is feeling much better. 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 States he felt like his face was beginning to swell, took benadryl. Continues to use Budesonide twice daily and Albuterol neb at home. Occasional cough with deep breath, slight production with clear mucus. Returned to work, denies shortness of breath with walking. He has stopped vaping. Denies fever.    No Known Allergies   There is no immunization history on file for this patient.  Past Medical History:  Diagnosis Date  . Anxiety   .  Bipolar disorder ([place])   . Depression   . Headache(784.0)    migraines  . OCD (obsessive compulsive disorder)   . Parotid mass   . Sebaceous cyst     Tobacco History: Social History   Tobacco Use  Smoking Status Former Smoker  . Types: Cigarettes  . Last attempt to quit: 01/05/2005  . Years since quitting: 13.3  Smokeless Tobacco Never Used   Counseling given: Not Answered   Outpatient Medications Prior to Visit  Medication Sig Dispense Refill  . ALPRAZolam (XANAX) 0.25 MG tablet Take 0.25 mg by mouth daily as needed for anxiety.   0  . cyclobenzaprine (FLEXERIL) 10 MG tablet Take 1 tablet (10 mg total) by mouth 2 (two) times daily as needed for muscle spasms. (Patient taking differently: Take 10-20 mg by mouth 2 (two) times daily as needed for muscle spasms. ) 30 tablet 0  . famotidine (PEPCID) 20 MG tablet Take 1 tablet (20 mg total) by mouth 2 (two) times daily. 60 tablet 1  . fluticasone (FLONASE) 50 MCG/ACT nasal spray Place 2 sprays into both nostrils daily. 16 g 2  . ipratropium-albuterol (DUONEB) 0.5-2.5 (3) MG/3ML SOLN Take 3 mLs by nebulization 3 (three) times daily. 360 mL 1  . lamoTRIgine (LAMICTAL) 100 MG tablet Take 100 mg by mouth at bedtime.   1  . metaxalone (SKELAXIN) 800 MG tablet Take 400-800 mg by mouth 3 (three) times daily as needed for muscle spasms.   1  . saccharomyces boulardii (FLORASTOR) 250 MG capsule Take 1 capsule (250 mg total) by mouth 2 (two) times daily.    Marland Kitchen  sertraline (ZOLOFT) 100 MG tablet Take 100 mg by mouth at bedtime.  2  . albuterol (PROVENTIL) (2.5 MG/3ML) 0.083% nebulizer solution Take 3 mLs (2.5 mg total) by nebulization every 2 (two) hours as needed for wheezing. 75 mL 12  . budesonide (PULMICORT) 0.5 MG/2ML nebulizer solution Take 2 mLs (0.5 mg total) by nebulization 2 (two) times daily. 2 mL 0  . predniSONE (DELTASONE) 10 MG tablet Start taking 60 mg that is 6  tablets for the first 3 days taper by 10 mg every 3days to 10 mg daily  until done (Patient not taking: Reported on 05/16/2018) 50 tablet 0   No facility-administered medications prior to visit.     Review of Systems  Review of Systems  Constitutional: Negative.   HENT: Negative.   Respiratory: Positive for cough.   Cardiovascular: Negative.     Physical Exam  BP 110/80   Pulse 99   Ht 5\' 11"  (1.803 m)   Wt 169 lb 9.6 oz (76.9 kg)   SpO2 94%   BMI 23.65 kg/m  Physical Exam  Constitutional: He is oriented to person, place, and time. He appears well-developed and well-nourished.  HENT:  Head: Normocephalic and atraumatic.  Eyes: Pupils are equal, round, and reactive to light. EOM are normal.  Neck: Normal range of motion. Neck supple.  Cardiovascular: Normal rate and regular rhythm.  Pulmonary/Chest: Effort normal and breath sounds normal. No stridor. No respiratory distress. He has no wheezes. He has no rales. He exhibits no tenderness.  Musculoskeletal: Normal range of motion.  Neurological: He is alert and oriented to person, place, and time.  Skin: Skin is warm and dry.  Psychiatric: He has a normal mood and affect. His behavior is normal. Judgment and thought content normal.     Lab Results:  CBC    Component Value Date/Time   WBC 14.3 (H) 05/01/2018 0527   RBC 4.52 05/01/2018 0527   HGB 13.6 05/01/2018 0527   HCT 40.8 05/01/2018 0527   PLT 522 (H) 05/01/2018 0527   MCV 90.3 05/01/2018 0527   MCV 89.3 01/06/2012 0932   MCH 30.1 05/01/2018 0527   MCHC 33.3 05/01/2018 0527   RDW 12.8 05/01/2018 0527   LYMPHSABS 1.0 05/01/2018 0527   MONOABS 0.6 05/01/2018 0527   EOSABS 0.1 05/01/2018 0527   BASOSABS 0.0 05/01/2018 0527    BMET    Component Value Date/Time   NA 140 04/30/2018 0514   K 3.1 (L) 04/30/2018 0514   CL 98 04/30/2018 0514   CO2 31 04/30/2018 0514   GLUCOSE 107 (H) 04/30/2018 0514   BUN 15 04/30/2018 0514   CREATININE 0.80 05/01/2018 0527   CREATININE 0.93 03/15/2012 1459   CALCIUM 8.9 04/30/2018 0514    GFRNONAA >60 05/01/2018 0527   GFRAA >60 05/01/2018 0527    BNP    Component Value Date/Time   BNP 69.7 04/24/2018 2044    ProBNP No results found for: PROBNP  Imaging: Dg Chest 2 View  Result Date: 04/22/2018 CLINICAL DATA:  Shortness of breath EXAM: CHEST - 2 VIEW COMPARISON:  None. FINDINGS: There is patchy airspace consolidation in the lower lobes bilaterally, slightly more on the right than on the left. Lungs elsewhere are clear. The heart size and pulmonary vascular normal. No adenopathy. No bone lesions. IMPRESSION: Patchy bibasilar infiltrate, slightly more on the right than on the left, felt to represent pneumonia. Lungs elsewhere clear. No adenopathy evident. Electronically Signed   By: Lowella Grip III M.D.  On: 04/22/2018 13:50   Ct Head Wo Contrast  Result Date: 04/24/2018 CLINICAL DATA:  Pain in the jaw and headache EXAM: CT HEAD WITHOUT CONTRAST CT MAXILLOFACIAL WITHOUT CONTRAST TECHNIQUE: Multidetector CT imaging of the head and maxillofacial structures were performed using the standard protocol without intravenous contrast. Multiplanar CT image reconstructions of the maxillofacial structures were also generated. COMPARISON:  Multiple exams, including brain MRI dated 06/14/2015 FINDINGS: CT HEAD FINDINGS Brain: The brainstem, cerebellum, cerebral peduncles, thalami, basal ganglia, basilar cisterns, and ventricular system appear within normal limits. No intracranial hemorrhage, mass lesion, or acute CVA. Vascular: Unremarkable Skull: Unremarkable Other: No supplemental non-categorized findings. CT MAXILLOFACIAL FINDINGS Osseous: No facial fracture identified. No significant abnormal periapical lucency in the maxilla or mandible. Orbits: Unremarkable Sinuses: Mild chronic frontal, ethmoid, left sphenoid, and bilateral maxillary sinusitis. Soft tissues: Although the patient's metal fillings partially obscure some of the area around the mandible, and we do not have IV contrast to  assess for enhancement, I do not demonstrate a definite abscess along the mandible on today's exam to account for the patient's jaw pain. No significant regional adenopathy. No thickening of the epiglottis. No significant mandibular condylar abnormality. Upper cervical spine unremarkable. IMPRESSION: 1. No significant intracranial abnormality. 2. Mild chronic paranasal sinusitis. No other appreciable facial abnormality. No periapical lucency associated with the teeth. Electronically Signed   By: Van Clines M.D.   On: 04/24/2018 14:02   Ct Angio Chest Pe W Or Wo Contrast  Result Date: 04/24/2018 CLINICAL DATA:  Shortness of breath. Fatigue. Cough and fever. Malaise. Headache. EXAM: CT ANGIOGRAPHY CHEST WITH CONTRAST TECHNIQUE: Multidetector CT imaging of the chest was performed using the standard protocol during bolus administration of intravenous contrast. Multiplanar CT image reconstructions and MIPs were obtained to evaluate the vascular anatomy. CONTRAST:  136mL ISOVUE-370 IOPAMIDOL (ISOVUE-370) INJECTION 76% COMPARISON:  04/22/2018 chest radiograph FINDINGS: Cardiovascular: No large or central pulmonary emboli are identified. The small emboli, particularly at the segmental and subsegmental level, cannot be readily excluded due to the degree of motion artifact. I am not confident that repeating the study, with additional doses of radiation and contrast and associated risk, would provide a different result with regard to sensitivity and negative predictive value. No acute aortic findings.  Heart size within normal limits. Mediastinum/Nodes: Small mediastinal lymph nodes may be reactive but are not pathologically enlarged. Lungs/Pleura: Bilateral patchy ground-glass opacities are present with confluent airspace opacities dependently in both upper lobes and especially in both lower lobes. Air bronchograms noted in the left lower lobe consolidation regions. Upper Abdomen: Unremarkable Musculoskeletal:  Unremarkable Review of the MIP images confirms the above findings. IMPRESSION: 1. No filling defect is identified in the pulmonary arterial tree to suggest pulmonary embolus. Sensitivity for segmental and subsegmental emboli is low due to motion artifact. I do not sense that repeating the exam would improve the motion artifact issue. 2. Regions of symmetric consolidation in both lower lobes, with some dependent airspace opacity in the upper lobes and with considerable bilateral ground-glass opacities scattered in the lungs with some immediate subpleural sparing. Possibilities include multilobar bacterial pneumonia, atypical pneumonia, or aspiration pneumonia. Entities such as pulmonary edema and pulmonary hemorrhage are considered less likely. Electronically Signed   By: Van Clines M.D.   On: 04/24/2018 13:57   Dg Chest Port 1 View  Result Date: 04/27/2018 CLINICAL DATA:  Cough, fever, shortness of breath. EXAM: PORTABLE CHEST 1 VIEW COMPARISON:  04/25/2018 FINDINGS: Patchy bilateral airspace disease again noted with lower lobe  predominance, as well as interstitial prominence. Slightly worsened since prior study. Heart is upper limits normal in size. No visible significant effusions or acute bony abnormality. IMPRESSION: Slight interval worsening in patchy bilateral lower lobe airspace opacities and interstitial prominence within the lungs. This could reflect edema or infection. Electronically Signed   By: Rolm Baptise M.D.   On: 04/27/2018 11:30   Dg Chest Port 1 View  Result Date: 04/25/2018 CLINICAL DATA:  Respiratory failure. EXAM: PORTABLE CHEST 1 VIEW COMPARISON:  Chest CT 04/24/2018 FINDINGS: Cardiomediastinal silhouette is normal. Mediastinal contours appear intact. There is no evidence of pneumothorax. Patchy bilateral airspace consolidation with lower lobe predominance. Diffuse haziness of the interstitial markings. Osseous structures are without acute abnormality. Soft tissues are grossly  normal. IMPRESSION: Patchy bilateral airspace consolidation with lower lobe predominance. Probable superimposed interstitial pulmonary edema. Electronically Signed   By: Fidela Salisbury M.D.   On: 04/25/2018 07:22   Ct Maxillofacial Wo Contrast  Result Date: 04/24/2018 CLINICAL DATA:  Pain in the jaw and headache EXAM: CT HEAD WITHOUT CONTRAST CT MAXILLOFACIAL WITHOUT CONTRAST TECHNIQUE: Multidetector CT imaging of the head and maxillofacial structures were performed using the standard protocol without intravenous contrast. Multiplanar CT image reconstructions of the maxillofacial structures were also generated. COMPARISON:  Multiple exams, including brain MRI dated 06/14/2015 FINDINGS: CT HEAD FINDINGS Brain: The brainstem, cerebellum, cerebral peduncles, thalami, basal ganglia, basilar cisterns, and ventricular system appear within normal limits. No intracranial hemorrhage, mass lesion, or acute CVA. Vascular: Unremarkable Skull: Unremarkable Other: No supplemental non-categorized findings. CT MAXILLOFACIAL FINDINGS Osseous: No facial fracture identified. No significant abnormal periapical lucency in the maxilla or mandible. Orbits: Unremarkable Sinuses: Mild chronic frontal, ethmoid, left sphenoid, and bilateral maxillary sinusitis. Soft tissues: Although the patient's metal fillings partially obscure some of the area around the mandible, and we do not have IV contrast to assess for enhancement, I do not demonstrate a definite abscess along the mandible on today's exam to account for the patient's jaw pain. No significant regional adenopathy. No thickening of the epiglottis. No significant mandibular condylar abnormality. Upper cervical spine unremarkable. IMPRESSION: 1. No significant intracranial abnormality. 2. Mild chronic paranasal sinusitis. No other appreciable facial abnormality. No periapical lucency associated with the teeth. Electronically Signed   By: Van Clines M.D.   On: 04/24/2018  14:02     Assessment & Plan:   CAP (community acquired pneumonia)/Bilateral - Doing well - VSS. Exam benign, lungs CTA.  - Follow up CT chest wo contrast and OV with Dr. Chase Caller in 3 months  Mild persistent asthma - Limit exposure to allergens, wear mask around dust particles or working in yard - Change budesonide nebulizer to Pulmicort inhaler for enhanced compliance  - PRN albuterol hfa for sob/wheezing   Acute respiratory failure with hypoxia ([place]) Resolved; ambulatory O2 sat maintained >95% RA  Sepsis ([place]) - Afebrile - Checking CBC/BMET   Marijuana user/Vapes THC - Patient has stopped vaping  - Counseled on importance of continued cessation      Martyn Ehrich, NP 05/16/2018

## 2018-05-16 NOTE — Assessment & Plan Note (Addendum)
-   Patient has stopped vaping  - Counseled on importance of continued cessation

## 2018-05-16 NOTE — Assessment & Plan Note (Addendum)
-   Doing well - VSS. Exam benign, lungs CTA.  - Follow up CT chest wo contrast and OV with Dr. Chase Caller in 3 months

## 2018-05-16 NOTE — Patient Instructions (Signed)
Thanks for coming in today, you looked well. Exam was benign. Vital signs were stable  Rx: - Pulmicort inhaler - Take 2 puffs twice a day (this is your maintenance inhaler take everyday no matter how you feel)  - Albuterol rescue inhaler- Take 2 puffs every 4-6 hours as needed for shortness of breath/wheezing   Testing: - Plan repeat CT chest without contrast in 3 months  RE: follow up bilateral PNA   Follow-up: - Dr. Chase Caller in 3 months   Recommendations: NO vaping, thanks for your continued hard work on this  NO smoking

## 2018-05-16 NOTE — Assessment & Plan Note (Signed)
-   Limit exposure to allergens, wear mask around dust particles or working in yard - Change budesonide nebulizer to Pulmicort inhaler for enhanced compliance  - PRN albuterol hfa for sob/wheezing

## 2018-05-16 NOTE — Assessment & Plan Note (Signed)
Resolved; ambulatory O2 sat maintained >95% RA

## 2018-05-16 NOTE — Assessment & Plan Note (Signed)
-   Afebrile - Checking CBC/BMET

## 2018-06-27 DIAGNOSIS — E876 Hypokalemia: Secondary | ICD-10-CM | POA: Diagnosis not present

## 2018-06-27 DIAGNOSIS — G43909 Migraine, unspecified, not intractable, without status migrainosus: Secondary | ICD-10-CM | POA: Diagnosis not present

## 2018-06-27 DIAGNOSIS — I10 Essential (primary) hypertension: Secondary | ICD-10-CM | POA: Diagnosis not present

## 2018-07-11 DIAGNOSIS — I10 Essential (primary) hypertension: Secondary | ICD-10-CM | POA: Diagnosis not present

## 2018-07-11 DIAGNOSIS — R2232 Localized swelling, mass and lump, left upper limb: Secondary | ICD-10-CM | POA: Diagnosis not present

## 2018-07-11 DIAGNOSIS — Z6826 Body mass index (BMI) 26.0-26.9, adult: Secondary | ICD-10-CM | POA: Diagnosis not present

## 2018-07-18 ENCOUNTER — Ambulatory Visit: Payer: Self-pay | Admitting: Internal Medicine

## 2018-07-26 DIAGNOSIS — I10 Essential (primary) hypertension: Secondary | ICD-10-CM | POA: Diagnosis not present

## 2018-07-26 DIAGNOSIS — K649 Unspecified hemorrhoids: Secondary | ICD-10-CM | POA: Diagnosis not present

## 2018-07-26 DIAGNOSIS — K625 Hemorrhage of anus and rectum: Secondary | ICD-10-CM | POA: Diagnosis not present

## 2018-08-15 ENCOUNTER — Ambulatory Visit (INDEPENDENT_AMBULATORY_CARE_PROVIDER_SITE_OTHER)
Admission: RE | Admit: 2018-08-15 | Discharge: 2018-08-15 | Disposition: A | Discharge status: Home / Self Care | Payer: 59 | Source: Ambulatory Visit | Attending: Primary Care | Admitting: Primary Care

## 2018-08-15 DIAGNOSIS — R0602 Shortness of breath: Secondary | ICD-10-CM | POA: Diagnosis not present

## 2018-08-15 DIAGNOSIS — J189 Pneumonia, unspecified organism: Secondary | ICD-10-CM | POA: Diagnosis not present

## 2018-08-25 ENCOUNTER — Encounter: Payer: Self-pay | Admitting: Internal Medicine

## 2018-08-25 ENCOUNTER — Ambulatory Visit: Payer: 59 | Admitting: Internal Medicine

## 2018-08-25 VITALS — BP 118/78 | HR 83 | Ht 71.0 in | Wt 198.0 lb

## 2018-08-25 DIAGNOSIS — J9601 Acute respiratory failure with hypoxia: Secondary | ICD-10-CM

## 2018-08-25 DIAGNOSIS — R0609 Other forms of dyspnea: Secondary | ICD-10-CM | POA: Diagnosis not present

## 2018-08-25 DIAGNOSIS — F1290 Cannabis use, unspecified, uncomplicated: Secondary | ICD-10-CM

## 2018-08-25 NOTE — Patient Instructions (Signed)
Marijuana user/Vapes THC -glad this is resolved and you quit  Acute respiratory failure with hypoxia ([place]) - resolved, CT chest dec 2019 is shows total healing  Dyspnea on exertion - residual post lung injury - could be physical deconditioning v exercise induced airway spasm v both  - start albuterol 10 min before exercise  - commit to 2-3 times exercise (aerobic) per week - target heart rate -  - you can go up to HR 148 and if you are feeling short of breath at this point and unable to go further this is normal  - if you are getting to this HR really fast doing very little work this could mean physical decondtioning  - if you are getting short of breath for HR much lower HR then asthma, deconditioning could all be playing a role  - use this as a guide doing exercise but follow your symptoms  Followup 6 months or sooner  - testing if problems persist at followup

## 2018-08-25 NOTE — Progress Notes (Signed)
@Patient  ID: Eric [name], male    DOB: [birth year], 33 y.o.   MRN: [id number]  Chief Complaint  Patient presents with  . Hospitalization Follow-up    States he feels much better since hospital visit. States he was having increased SOB for 5 days and went to ER. Completed pred taper., States over he weekend he had a SOb episode and his BP was elevated.    Referring provider: Haywood Pao, MD  HPI: 33 year old male, former smoker (quit 2006). PMH asthma, bipolar disorder. Patient of Dr. Chase Caller, seen in hospital for initial consult. Patient admitted from 09/06-09/15 for acute respiratory failure with hypoxia, CAP and asthma.   Hospital course: Patient presented to ED with cough, fever, sob and fatigue x2-3 days. No prior hospitalizations. Admitted to using THC vaping. Drug screen positive for THC. O2 saturation on admission was 86% RA. CT showed bilateral groundglass opacities. ECHO showed EF 60-65%. Admitted for sepsis secondary to pneumonia. Transferred to stepdown and placed on BiPAP. Started on Azithromycin, ceftriaxone and solumedrol. Antibiotics were broadended and treated with vancomycin, Zosyn. Received Pulmicort, duo nebs and Albuterol. Sputum culture negative. Respiratory panel negative. Discharged with O2 sat holding above 92%. Needs DME nebulizer.   05/16/2018 Patient presents today for hospital follow up. States that he is feeling much better. 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 States he felt like his face was beginning to swell, took benadryl. Continues to use Budesonide twice daily and Albuterol neb at home. Occasional cough with deep breath, slight production with clear mucus. Returned to work, denies shortness of breath with walking. He has stopped vaping. Denies fever.   OV 08/25/2018  Subjective:  Patient ID: Eric [name], male , DOB: [birth year] , age 89 y.o. , MRN: [id number] , ADDRESS: [place] [address]   08/25/2018 -   Chief Complaint  Patient presents with  . Follow-up    Pt seen by Derl Barrow, NP 05/16/18. Pt had CT performed 08/15/18.  Pt has had complaints of SOB with activities and states he has an occ shooting pain in his chest.     HPI Eric [name] 33 y.o. -presents for follow-up of acute lung injury secondary to vaping sustained in summer 2019.  Since then he is a lot better.  He is back to work.  He is gained weight.  His significant other is here with him.  He is completely quit smoking cigarettes and doing marijuana and vaping.  He has gained a lot of weight.  He is nervous that his lung injury has not resolved but the CT chest August 15, 2018 shows complete resolution.  I personally visualized this film.  His main concern is that he still has some residual shortness of breath with exertion particularly when he tries to exercise.  He is nervous about this.  Occasionally feels he might be wheezing but there is no cough.   He refused flu shot    ROS - per HPI     has a past medical history of Anxiety, Bipolar disorder ([place]), Depression, Headache(784.0), OCD (obsessive compulsive disorder), Parotid mass, and Sebaceous cyst.   reports that he quit smoking about 13 years ago. His smoking use included cigarettes. He has never used smokeless tobacco.  Past Surgical History:  Procedure Laterality Date  . Excision Benign Cyst    . FOREIGN BODY REMOVAL ABDOMINAL Left 03/12/2016   Procedure: EXCISION  FOREIGN BODY FROM  ABDOMINAL WALL;  Surgeon: Coralie Keens, MD;  Location: [facility];  Service: General;  Laterality: Left;  . WISDOM TOOTH EXTRACTION Right     No Known Allergies   There is no immunization history on file for this patient.  No family history on file.   Current Outpatient Medications:  .  ALPRAZolam (XANAX) 0.25 MG tablet, Take 0.25 mg by mouth daily as needed for anxiety. , Disp: , Rfl: 0 .  Budesonide (PULMICORT  FLEXHALER) 90 MCG/ACT inhaler, Inhale 2 puffs into the lungs 2 (two) times daily., Disp: 1 Inhaler, Rfl: 6 .  lamoTRIgine (LAMICTAL) 150 MG tablet, Take 100 mg by mouth daily. , Disp: , Rfl: 1 .  olmesartan (BENICAR) 20 MG tablet, Take 20 mg by mouth daily., Disp: , Rfl:  .  SERTRALINE HCL PO, Take 150 mg by mouth at bedtime. , Disp: , Rfl: 2 .  albuterol (PROVENTIL HFA;VENTOLIN HFA) 108 (90 Base) MCG/ACT inhaler, Inhale 2 puffs into the lungs every 6 (six) hours as needed for wheezing or shortness of breath. (Patient not taking: Reported on 08/25/2018), Disp: 1 Inhaler, Rfl: 2 .  cyclobenzaprine (FLEXERIL) 10 MG tablet, Take 1 tablet (10 mg total) by mouth 2 (two) times daily as needed for muscle spasms. (Patient not taking: Reported on 08/25/2018), Disp: 30 tablet, Rfl: 0 .  metaxalone (SKELAXIN) 800 MG tablet, Take 400-800 mg by mouth 3 (three) times daily as needed for muscle spasms. , Disp: , Rfl: 1      Objective:   Vitals:   08/25/18 1611  BP: 118/78  Pulse: 83  SpO2: 96%  Weight: 198 lb (89.8 kg)  Height: 5\' 11"  (1.803 m)    Estimated body mass index is 27.62 kg/m as calculated from the following:   Height as of this encounter: 5\' 11"  (1.803 m).   Weight as of this encounter: 198 lb (89.8 kg).  @WEIGHTCHANGE @  [employer]   08/25/18 1611  Weight: 198 lb (89.8 kg)     Physical Exam  General Appearance:    Alert, cooperative, no distress, appears stated age - yes , Deconditioned looking - no , OBESE  - no, Sitting on Wheelchair -  no  Head:    Normocephalic, without obvious abnormality, atraumatic  Eyes:    PERRL, conjunctiva/corneas clear,  Ears:    Normal TM's and external ear canals, both ears  Nose:   Nares normal, septum midline, mucosa normal, no drainage    or sinus tenderness. OXYGEN ON  - no . Patient is @ ra   Throat:   Lips, mucosa, and tongue normal; teeth and gums normal. Cyanosis on lips - no  Neck:   Supple, symmetrical, trachea midline, no adenopathy;     thyroid:  no enlargement/tenderness/nodules; no carotid   bruit or JVD  Back:     Symmetric, no curvature, ROM normal, no CVA tenderness  Lungs:     Distress - no , Wheeze no, Barrell Chest - no, Purse lip breathing - no, Crackles - no   Chest Wall:    No tenderness or deformity.    Heart:    Regular rate and rhythm, S1 and S2 normal, no rub   or gallop, Murmur - no  Breast Exam:    NOT DONE  Abdomen:     Soft, non-tender, bowel sounds active all four quadrants,    no masses, no organomegaly. Visceral obesity - no  Genitalia:   NOT DONE  Rectal:  NOT DONE  Extremities:   Extremities - normal, Has Cane - no, Clubbing - no, Edema - no  Pulses:   2+ and symmetric all extremities  Skin:   Stigmata of Connective Tissue Disease - no  Lymph nodes:   Cervical, supraclavicular, and axillary nodes normal  Psychiatric:  Neurologic:   Pleasant - yes, Anxious - mild yes, Flat affect - no  CAm-ICU - neg, Alert and Oriented x 3 - yes, Moves all 4s - yes, Speech - normal, Cognition - intact           Assessment:       ICD-10-CM   1. Marijuana user/Vapes THC F12.90   2. Acute respiratory failure with hypoxia (HCC) J96.01   3. Dyspnea on exertion R06.09        Plan:     Patient Instructions  Marijuana user/Vapes THC -glad this is resolved and you quit  Acute respiratory failure with hypoxia ([place]) - resolved, CT chest dec 2019 is shows total healing  Dyspnea on exertion - residual post lung injury - could be physical deconditioning v exercise induced airway spasm v both  - start albuterol 10 min before exercise  - commit to 2-3 times exercise (aerobic) per week - target heart rate -  - you can go up to HR 148 and if you are feeling short of breath at this point and unable to go further this is normal  - if you are getting to this HR really fast doing very little work this could mean physical decondtioning  - if you are getting short of breath for HR much lower HR then asthma,  deconditioning could all be playing a role  - use this as a guide doing exercise but follow your symptoms  Followup 6 months or sooner  - testing if problems persist at followup       SIGNATURE    Dr. Brand Males, M.D., F.C.C.P,  Pulmonary and Critical Care Medicine Staff Physician, [facility] Director - Interstitial Lung Disease  Program  [facility] [place], [place], [address]  Pager: [phone], If no answer or between  15:00h - 7:00h: call [phone] Telephone: [phone]  5:18 PM 08/25/2018

## 2018-09-08 DIAGNOSIS — K602 Anal fissure, unspecified: Secondary | ICD-10-CM | POA: Diagnosis not present

## 2018-10-04 DIAGNOSIS — J4530 Mild persistent asthma, uncomplicated: Secondary | ICD-10-CM | POA: Diagnosis not present

## 2018-10-04 DIAGNOSIS — I10 Essential (primary) hypertension: Secondary | ICD-10-CM | POA: Diagnosis not present

## 2018-10-04 DIAGNOSIS — K602 Anal fissure, unspecified: Secondary | ICD-10-CM | POA: Diagnosis not present

## 2018-10-04 DIAGNOSIS — J3089 Other allergic rhinitis: Secondary | ICD-10-CM | POA: Diagnosis not present

## 2018-10-27 DIAGNOSIS — K645 Perianal venous thrombosis: Secondary | ICD-10-CM | POA: Diagnosis not present

## 2018-10-27 DIAGNOSIS — K602 Anal fissure, unspecified: Secondary | ICD-10-CM | POA: Diagnosis not present

## 2019-01-26 DIAGNOSIS — F331 Major depressive disorder, recurrent, moderate: Secondary | ICD-10-CM | POA: Diagnosis not present

## 2019-02-09 DIAGNOSIS — F331 Major depressive disorder, recurrent, moderate: Secondary | ICD-10-CM | POA: Diagnosis not present

## 2019-02-23 DIAGNOSIS — F331 Major depressive disorder, recurrent, moderate: Secondary | ICD-10-CM | POA: Diagnosis not present

## 2019-03-09 DIAGNOSIS — F331 Major depressive disorder, recurrent, moderate: Secondary | ICD-10-CM | POA: Diagnosis not present

## 2019-03-10 DIAGNOSIS — F411 Generalized anxiety disorder: Secondary | ICD-10-CM | POA: Diagnosis not present

## 2019-03-10 DIAGNOSIS — F319 Bipolar disorder, unspecified: Secondary | ICD-10-CM | POA: Diagnosis not present

## 2019-03-10 DIAGNOSIS — F331 Major depressive disorder, recurrent, moderate: Secondary | ICD-10-CM | POA: Diagnosis not present

## 2019-03-23 DIAGNOSIS — F331 Major depressive disorder, recurrent, moderate: Secondary | ICD-10-CM | POA: Diagnosis not present

## 2019-04-06 DIAGNOSIS — K602 Anal fissure, unspecified: Secondary | ICD-10-CM | POA: Diagnosis not present

## 2019-04-06 DIAGNOSIS — F331 Major depressive disorder, recurrent, moderate: Secondary | ICD-10-CM | POA: Diagnosis not present

## 2019-04-06 DIAGNOSIS — F319 Bipolar disorder, unspecified: Secondary | ICD-10-CM | POA: Diagnosis not present

## 2019-04-06 DIAGNOSIS — F909 Attention-deficit hyperactivity disorder, unspecified type: Secondary | ICD-10-CM | POA: Diagnosis not present

## 2019-04-06 DIAGNOSIS — J4530 Mild persistent asthma, uncomplicated: Secondary | ICD-10-CM | POA: Diagnosis not present

## 2019-04-20 DIAGNOSIS — F331 Major depressive disorder, recurrent, moderate: Secondary | ICD-10-CM | POA: Diagnosis not present

## 2019-05-12 DIAGNOSIS — F331 Major depressive disorder, recurrent, moderate: Secondary | ICD-10-CM | POA: Diagnosis not present

## 2019-05-12 DIAGNOSIS — F319 Bipolar disorder, unspecified: Secondary | ICD-10-CM | POA: Diagnosis not present

## 2019-05-12 DIAGNOSIS — F411 Generalized anxiety disorder: Secondary | ICD-10-CM | POA: Diagnosis not present

## 2019-05-23 DIAGNOSIS — F331 Major depressive disorder, recurrent, moderate: Secondary | ICD-10-CM | POA: Diagnosis not present

## 2019-06-01 DIAGNOSIS — F331 Major depressive disorder, recurrent, moderate: Secondary | ICD-10-CM | POA: Diagnosis not present

## 2019-06-09 DIAGNOSIS — F331 Major depressive disorder, recurrent, moderate: Secondary | ICD-10-CM | POA: Diagnosis not present

## 2019-06-13 IMAGING — CT CT CHEST W/O CM
2 of 3 series · 15 of 36 positions shown, 18 images · non-contrast
Comparison: Chest radiographs 04/27/2018 and earlier. CTA chest
04/24/2018.

CLINICAL DATA: 32-year-old male status post pneumonia in [REDACTED].
Shortness of breath with exertion.

EXAM:
CT CHEST WITHOUT CONTRAST
TECHNIQUE: Multidetector CT imaging of the chest was performed following the
standard protocol without IV contrast.

[Series 2: thorax · axial · 0.74mm/px · z∈[+1296,+1550]mm · 12 of 151 slices shown, 15 images]
[im 12/151  mediastinal]
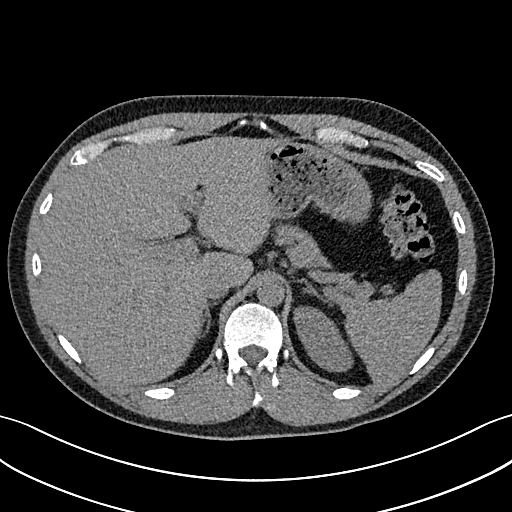
[im 12/151  lung]
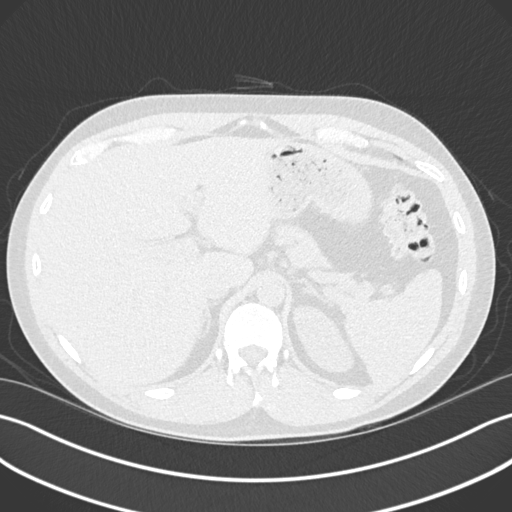
[im 23/151  lung]
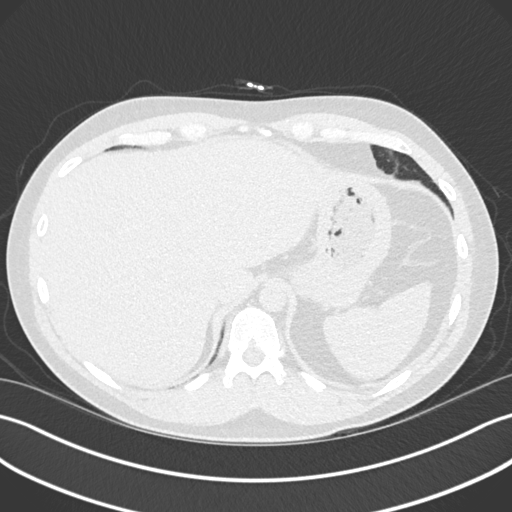
[im 34/151  lung]
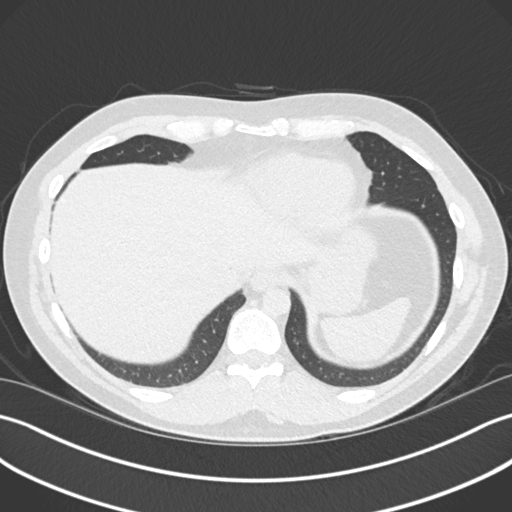
[im 45/151  lung]
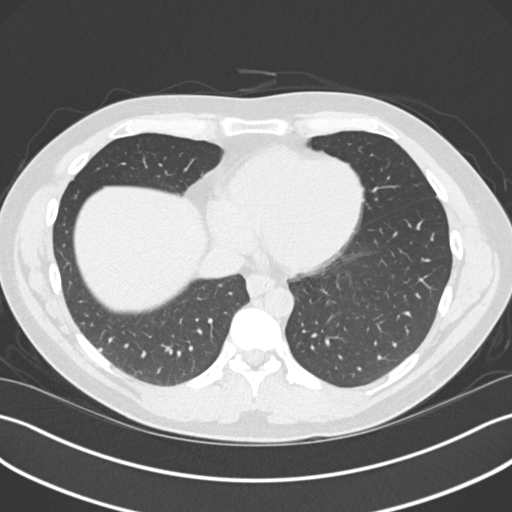
[im 56/151  mediastinal]
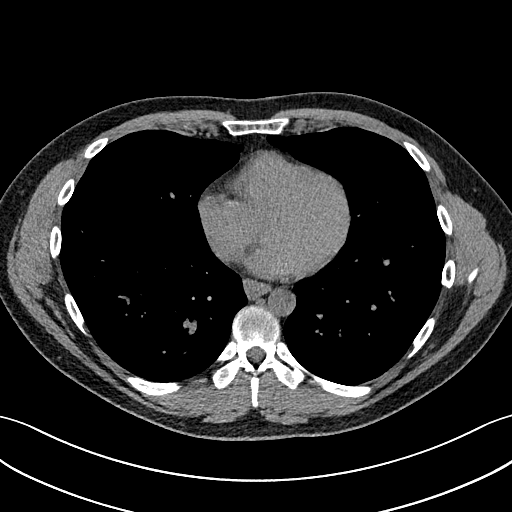
[im 56/151  lung]
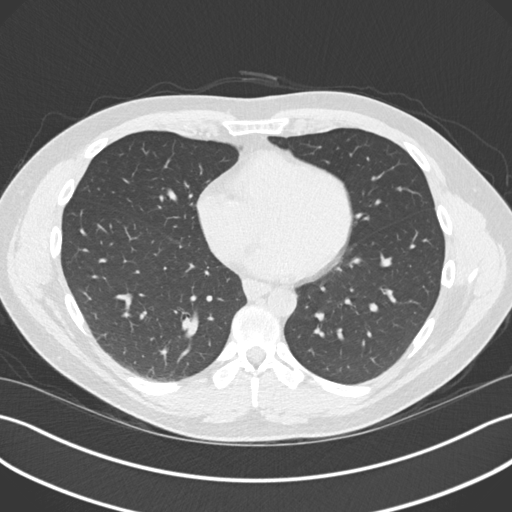
[im 67/151  lung]
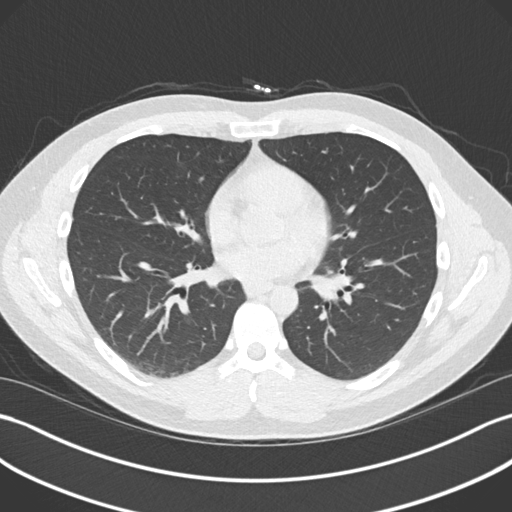
[im 84/151  lung]
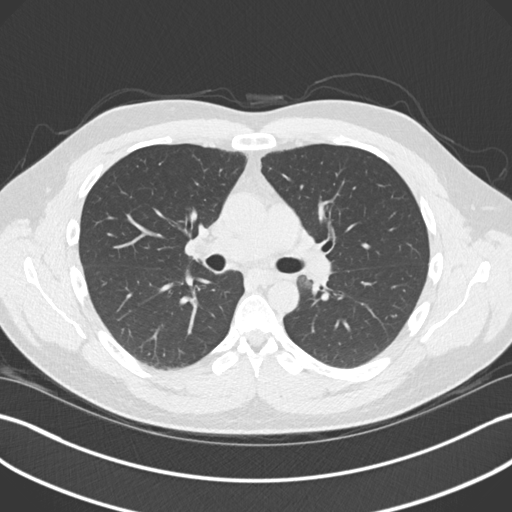
[im 95/151  lung]
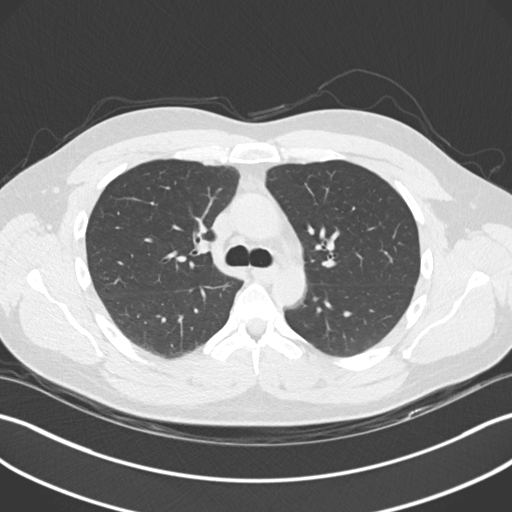
[im 106/151  mediastinal]
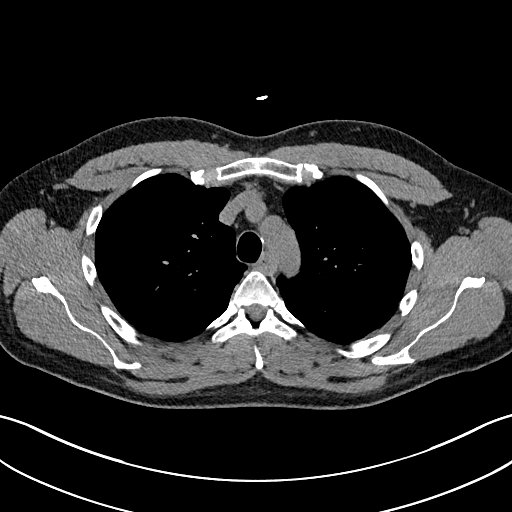
[im 106/151  lung]
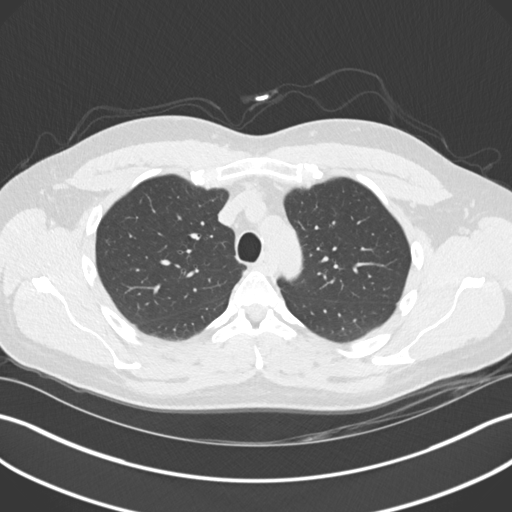
[im 117/151  lung]
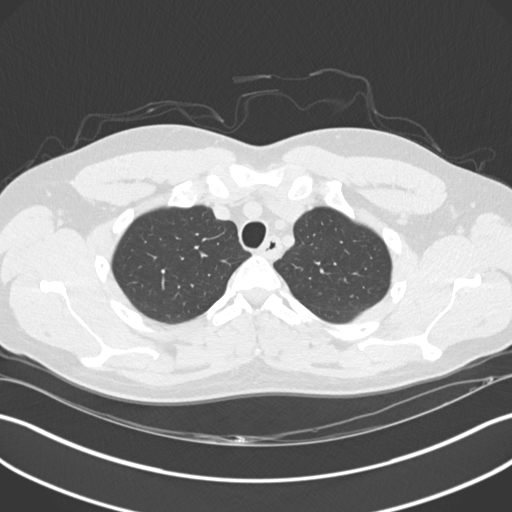
[im 128/151  lung]
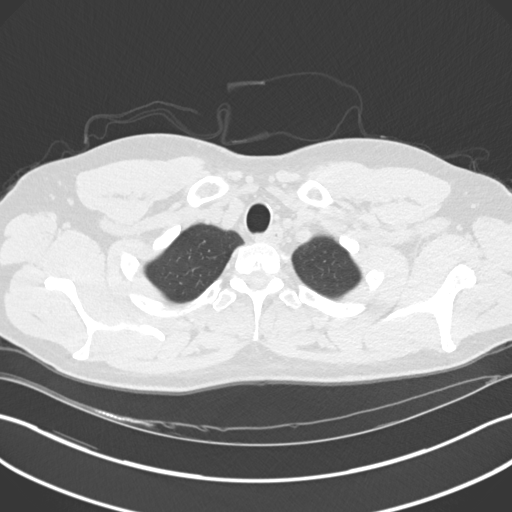
[im 139/151  lung]
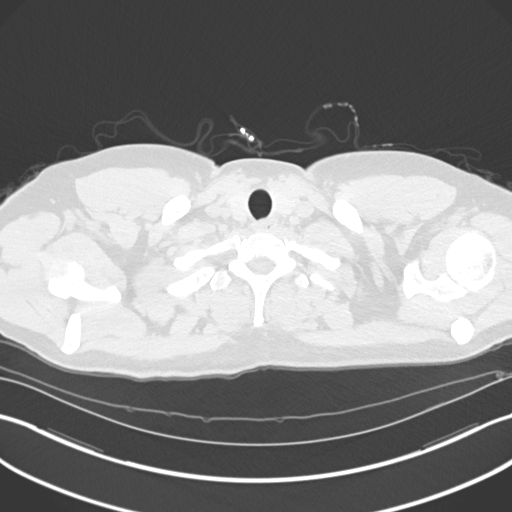

[Series 5: coronal · coronal · 0.59mm/px · 3 of 110 slices shown]
[im 22/110  lung]
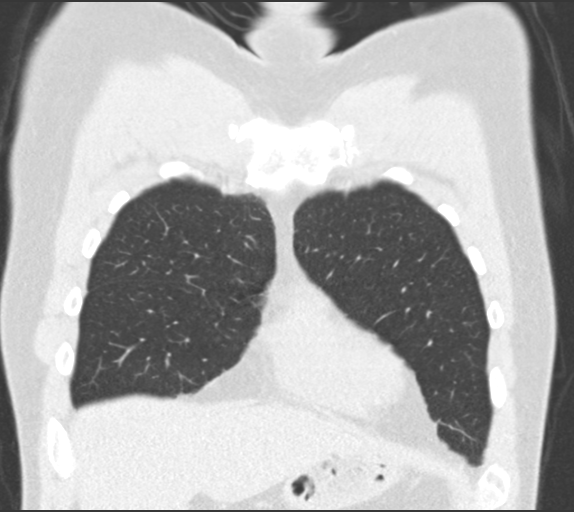
[im 44/110  lung]
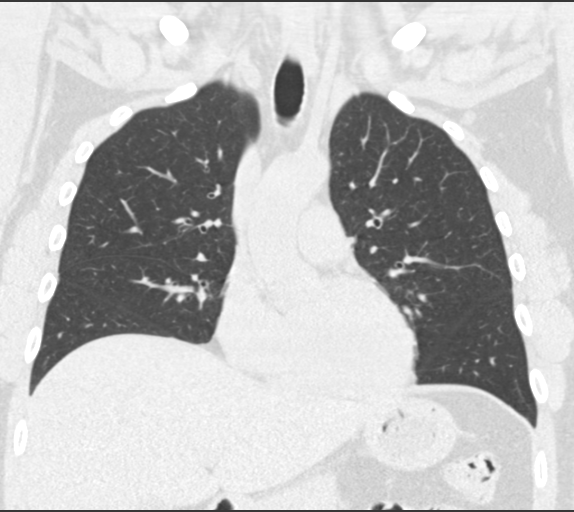
[im 66/110  lung]
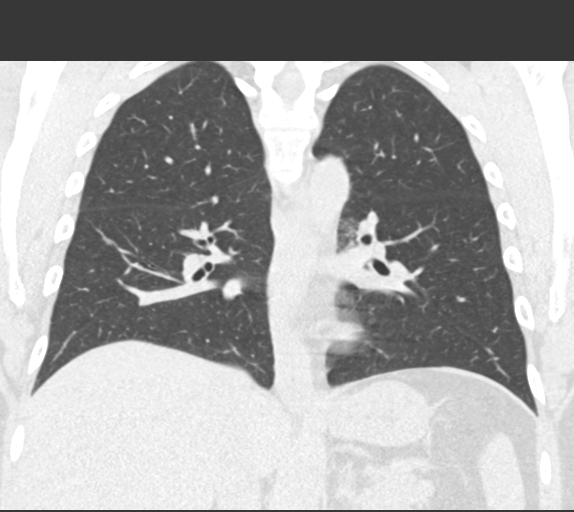

[15 of 36 positions shown; findings below may reference images not displayed]

FINDINGS: Cardiovascular: Negative noncontrast appearance; no cardiomegaly or
pericardial effusion. No calcified coronary artery atherosclerosis
is evident.

Mediastinum/Nodes: Negative, no lymphadenopathy.

Lungs/Pleura: Larger lung volumes. Major airways are patent.

No pleural effusion.

Resolved bilateral confluent pulmonary opacity since 04/24/2018.
Minimal dependent atelectasis today, mostly in the right lung. No
other abnormal pulmonary opacity.

Upper Abdomen: Negative noncontrast visible liver, gallbladder,
spleen, pancreas, adrenal glands, kidneys, and bowel in the upper
abdomen.

Musculoskeletal: Negative.
IMPRESSION: Resolved bilateral pulmonary opacity since [REDACTED]. Negative
noncontrast CT appearance of the chest now.

## 2019-06-16 DIAGNOSIS — F331 Major depressive disorder, recurrent, moderate: Secondary | ICD-10-CM | POA: Diagnosis not present

## 2019-07-06 DIAGNOSIS — F331 Major depressive disorder, recurrent, moderate: Secondary | ICD-10-CM | POA: Diagnosis not present

## 2019-07-20 DIAGNOSIS — F331 Major depressive disorder, recurrent, moderate: Secondary | ICD-10-CM | POA: Diagnosis not present

## 2019-08-03 DIAGNOSIS — F319 Bipolar disorder, unspecified: Secondary | ICD-10-CM | POA: Diagnosis not present

## 2019-08-03 DIAGNOSIS — F331 Major depressive disorder, recurrent, moderate: Secondary | ICD-10-CM | POA: Diagnosis not present

## 2019-08-03 DIAGNOSIS — F411 Generalized anxiety disorder: Secondary | ICD-10-CM | POA: Diagnosis not present

## 2019-08-24 DIAGNOSIS — F331 Major depressive disorder, recurrent, moderate: Secondary | ICD-10-CM | POA: Diagnosis not present

## 2019-09-05 DIAGNOSIS — F331 Major depressive disorder, recurrent, moderate: Secondary | ICD-10-CM | POA: Diagnosis not present

## 2019-09-21 DIAGNOSIS — F331 Major depressive disorder, recurrent, moderate: Secondary | ICD-10-CM | POA: Diagnosis not present

## 2019-10-09 DIAGNOSIS — I10 Essential (primary) hypertension: Secondary | ICD-10-CM | POA: Diagnosis not present

## 2019-10-09 DIAGNOSIS — F319 Bipolar disorder, unspecified: Secondary | ICD-10-CM | POA: Diagnosis not present

## 2019-10-09 DIAGNOSIS — J4530 Mild persistent asthma, uncomplicated: Secondary | ICD-10-CM | POA: Diagnosis not present

## 2019-10-09 DIAGNOSIS — Z1331 Encounter for screening for depression: Secondary | ICD-10-CM | POA: Diagnosis not present

## 2019-10-09 DIAGNOSIS — F329 Major depressive disorder, single episode, unspecified: Secondary | ICD-10-CM | POA: Diagnosis not present

## 2019-10-20 DIAGNOSIS — F331 Major depressive disorder, recurrent, moderate: Secondary | ICD-10-CM | POA: Diagnosis not present

## 2019-10-26 DIAGNOSIS — F411 Generalized anxiety disorder: Secondary | ICD-10-CM | POA: Diagnosis not present

## 2019-10-26 DIAGNOSIS — F319 Bipolar disorder, unspecified: Secondary | ICD-10-CM | POA: Diagnosis not present

## 2019-10-26 DIAGNOSIS — F331 Major depressive disorder, recurrent, moderate: Secondary | ICD-10-CM | POA: Diagnosis not present

## 2019-11-03 DIAGNOSIS — F331 Major depressive disorder, recurrent, moderate: Secondary | ICD-10-CM | POA: Diagnosis not present

## 2019-11-10 DIAGNOSIS — Z23 Encounter for immunization: Secondary | ICD-10-CM | POA: Diagnosis not present

## 2019-11-17 DIAGNOSIS — F331 Major depressive disorder, recurrent, moderate: Secondary | ICD-10-CM | POA: Diagnosis not present

## 2019-12-01 DIAGNOSIS — F331 Major depressive disorder, recurrent, moderate: Secondary | ICD-10-CM | POA: Diagnosis not present

## 2019-12-07 DIAGNOSIS — Z23 Encounter for immunization: Secondary | ICD-10-CM | POA: Diagnosis not present

## 2019-12-18 DIAGNOSIS — F331 Major depressive disorder, recurrent, moderate: Secondary | ICD-10-CM | POA: Diagnosis not present

## 2020-01-01 DIAGNOSIS — F331 Major depressive disorder, recurrent, moderate: Secondary | ICD-10-CM | POA: Diagnosis not present

## 2020-01-22 DIAGNOSIS — F331 Major depressive disorder, recurrent, moderate: Secondary | ICD-10-CM | POA: Diagnosis not present

## 2020-01-22 DIAGNOSIS — F319 Bipolar disorder, unspecified: Secondary | ICD-10-CM | POA: Diagnosis not present

## 2020-01-22 DIAGNOSIS — F411 Generalized anxiety disorder: Secondary | ICD-10-CM | POA: Diagnosis not present

## 2020-02-02 DIAGNOSIS — F331 Major depressive disorder, recurrent, moderate: Secondary | ICD-10-CM | POA: Diagnosis not present

## 2020-02-28 DIAGNOSIS — F331 Major depressive disorder, recurrent, moderate: Secondary | ICD-10-CM | POA: Diagnosis not present

## 2020-04-15 DIAGNOSIS — F331 Major depressive disorder, recurrent, moderate: Secondary | ICD-10-CM | POA: Diagnosis not present

## 2020-04-15 DIAGNOSIS — F319 Bipolar disorder, unspecified: Secondary | ICD-10-CM | POA: Diagnosis not present

## 2020-04-15 DIAGNOSIS — F411 Generalized anxiety disorder: Secondary | ICD-10-CM | POA: Diagnosis not present

## 2020-04-16 DIAGNOSIS — F331 Major depressive disorder, recurrent, moderate: Secondary | ICD-10-CM | POA: Diagnosis not present

## 2020-04-30 DIAGNOSIS — Z23 Encounter for immunization: Secondary | ICD-10-CM | POA: Diagnosis not present

## 2020-04-30 DIAGNOSIS — I10 Essential (primary) hypertension: Secondary | ICD-10-CM | POA: Diagnosis not present

## 2020-05-14 DIAGNOSIS — F331 Major depressive disorder, recurrent, moderate: Secondary | ICD-10-CM | POA: Diagnosis not present

## 2020-06-11 DIAGNOSIS — F331 Major depressive disorder, recurrent, moderate: Secondary | ICD-10-CM | POA: Diagnosis not present

## 2020-07-08 DIAGNOSIS — F411 Generalized anxiety disorder: Secondary | ICD-10-CM | POA: Diagnosis not present

## 2020-07-08 DIAGNOSIS — F319 Bipolar disorder, unspecified: Secondary | ICD-10-CM | POA: Diagnosis not present

## 2020-07-08 DIAGNOSIS — F331 Major depressive disorder, recurrent, moderate: Secondary | ICD-10-CM | POA: Diagnosis not present

## 2020-07-09 DIAGNOSIS — F331 Major depressive disorder, recurrent, moderate: Secondary | ICD-10-CM | POA: Diagnosis not present

## 2020-08-06 DIAGNOSIS — F331 Major depressive disorder, recurrent, moderate: Secondary | ICD-10-CM | POA: Diagnosis not present

## 2020-09-13 DIAGNOSIS — F331 Major depressive disorder, recurrent, moderate: Secondary | ICD-10-CM | POA: Diagnosis not present

## 2020-09-30 DIAGNOSIS — F319 Bipolar disorder, unspecified: Secondary | ICD-10-CM | POA: Diagnosis not present

## 2020-09-30 DIAGNOSIS — F411 Generalized anxiety disorder: Secondary | ICD-10-CM | POA: Diagnosis not present

## 2020-09-30 DIAGNOSIS — F331 Major depressive disorder, recurrent, moderate: Secondary | ICD-10-CM | POA: Diagnosis not present

## 2020-10-25 DIAGNOSIS — F331 Major depressive disorder, recurrent, moderate: Secondary | ICD-10-CM | POA: Diagnosis not present

## 2020-10-29 DIAGNOSIS — I10 Essential (primary) hypertension: Secondary | ICD-10-CM | POA: Diagnosis not present

## 2020-10-29 DIAGNOSIS — F419 Anxiety disorder, unspecified: Secondary | ICD-10-CM | POA: Diagnosis not present

## 2020-10-29 DIAGNOSIS — Z1389 Encounter for screening for other disorder: Secondary | ICD-10-CM | POA: Diagnosis not present

## 2020-10-29 DIAGNOSIS — Z1331 Encounter for screening for depression: Secondary | ICD-10-CM | POA: Diagnosis not present

## 2020-11-28 DIAGNOSIS — F331 Major depressive disorder, recurrent, moderate: Secondary | ICD-10-CM | POA: Diagnosis not present

## 2020-12-25 DIAGNOSIS — F411 Generalized anxiety disorder: Secondary | ICD-10-CM | POA: Diagnosis not present

## 2020-12-25 DIAGNOSIS — F319 Bipolar disorder, unspecified: Secondary | ICD-10-CM | POA: Diagnosis not present

## 2020-12-25 DIAGNOSIS — F331 Major depressive disorder, recurrent, moderate: Secondary | ICD-10-CM | POA: Diagnosis not present

## 2021-02-14 DIAGNOSIS — F331 Major depressive disorder, recurrent, moderate: Secondary | ICD-10-CM | POA: Diagnosis not present

## 2021-02-28 DIAGNOSIS — F331 Major depressive disorder, recurrent, moderate: Secondary | ICD-10-CM | POA: Diagnosis not present

## 2021-03-21 DIAGNOSIS — F331 Major depressive disorder, recurrent, moderate: Secondary | ICD-10-CM | POA: Diagnosis not present

## 2021-03-24 DIAGNOSIS — F319 Bipolar disorder, unspecified: Secondary | ICD-10-CM | POA: Diagnosis not present

## 2021-03-24 DIAGNOSIS — F331 Major depressive disorder, recurrent, moderate: Secondary | ICD-10-CM | POA: Diagnosis not present

## 2021-03-24 DIAGNOSIS — F411 Generalized anxiety disorder: Secondary | ICD-10-CM | POA: Diagnosis not present

## 2021-04-18 DIAGNOSIS — F331 Major depressive disorder, recurrent, moderate: Secondary | ICD-10-CM | POA: Diagnosis not present

## 2021-05-14 DIAGNOSIS — F319 Bipolar disorder, unspecified: Secondary | ICD-10-CM | POA: Diagnosis not present

## 2021-05-14 DIAGNOSIS — Z23 Encounter for immunization: Secondary | ICD-10-CM | POA: Diagnosis not present

## 2021-05-14 DIAGNOSIS — J4530 Mild persistent asthma, uncomplicated: Secondary | ICD-10-CM | POA: Diagnosis not present

## 2021-05-14 DIAGNOSIS — I10 Essential (primary) hypertension: Secondary | ICD-10-CM | POA: Diagnosis not present

## 2021-06-06 DIAGNOSIS — F331 Major depressive disorder, recurrent, moderate: Secondary | ICD-10-CM | POA: Diagnosis not present

## 2021-06-19 DIAGNOSIS — F411 Generalized anxiety disorder: Secondary | ICD-10-CM | POA: Diagnosis not present

## 2021-06-19 DIAGNOSIS — F319 Bipolar disorder, unspecified: Secondary | ICD-10-CM | POA: Diagnosis not present

## 2021-06-19 DIAGNOSIS — F331 Major depressive disorder, recurrent, moderate: Secondary | ICD-10-CM | POA: Diagnosis not present

## 2021-07-03 DIAGNOSIS — M9901 Segmental and somatic dysfunction of cervical region: Secondary | ICD-10-CM | POA: Diagnosis not present

## 2021-07-03 DIAGNOSIS — M9900 Segmental and somatic dysfunction of head region: Secondary | ICD-10-CM | POA: Diagnosis not present

## 2021-07-03 DIAGNOSIS — M9903 Segmental and somatic dysfunction of lumbar region: Secondary | ICD-10-CM | POA: Diagnosis not present

## 2021-07-03 DIAGNOSIS — M9905 Segmental and somatic dysfunction of pelvic region: Secondary | ICD-10-CM | POA: Diagnosis not present

## 2021-07-04 DIAGNOSIS — F331 Major depressive disorder, recurrent, moderate: Secondary | ICD-10-CM | POA: Diagnosis not present

## 2021-07-04 DIAGNOSIS — M9901 Segmental and somatic dysfunction of cervical region: Secondary | ICD-10-CM | POA: Diagnosis not present

## 2021-07-04 DIAGNOSIS — M9900 Segmental and somatic dysfunction of head region: Secondary | ICD-10-CM | POA: Diagnosis not present

## 2021-07-04 DIAGNOSIS — M9903 Segmental and somatic dysfunction of lumbar region: Secondary | ICD-10-CM | POA: Diagnosis not present

## 2021-07-04 DIAGNOSIS — M9905 Segmental and somatic dysfunction of pelvic region: Secondary | ICD-10-CM | POA: Diagnosis not present

## 2021-07-07 DIAGNOSIS — M9900 Segmental and somatic dysfunction of head region: Secondary | ICD-10-CM | POA: Diagnosis not present

## 2021-07-07 DIAGNOSIS — M9903 Segmental and somatic dysfunction of lumbar region: Secondary | ICD-10-CM | POA: Diagnosis not present

## 2021-07-07 DIAGNOSIS — M9901 Segmental and somatic dysfunction of cervical region: Secondary | ICD-10-CM | POA: Diagnosis not present

## 2021-07-07 DIAGNOSIS — M9905 Segmental and somatic dysfunction of pelvic region: Secondary | ICD-10-CM | POA: Diagnosis not present

## 2021-07-09 DIAGNOSIS — M9901 Segmental and somatic dysfunction of cervical region: Secondary | ICD-10-CM | POA: Diagnosis not present

## 2021-07-09 DIAGNOSIS — M9903 Segmental and somatic dysfunction of lumbar region: Secondary | ICD-10-CM | POA: Diagnosis not present

## 2021-07-09 DIAGNOSIS — M9905 Segmental and somatic dysfunction of pelvic region: Secondary | ICD-10-CM | POA: Diagnosis not present

## 2021-07-09 DIAGNOSIS — M9900 Segmental and somatic dysfunction of head region: Secondary | ICD-10-CM | POA: Diagnosis not present

## 2021-07-15 DIAGNOSIS — M9901 Segmental and somatic dysfunction of cervical region: Secondary | ICD-10-CM | POA: Diagnosis not present

## 2021-07-15 DIAGNOSIS — M9905 Segmental and somatic dysfunction of pelvic region: Secondary | ICD-10-CM | POA: Diagnosis not present

## 2021-07-15 DIAGNOSIS — M9903 Segmental and somatic dysfunction of lumbar region: Secondary | ICD-10-CM | POA: Diagnosis not present

## 2021-07-15 DIAGNOSIS — M9900 Segmental and somatic dysfunction of head region: Secondary | ICD-10-CM | POA: Diagnosis not present

## 2021-07-17 DIAGNOSIS — M9903 Segmental and somatic dysfunction of lumbar region: Secondary | ICD-10-CM | POA: Diagnosis not present

## 2021-07-17 DIAGNOSIS — M9901 Segmental and somatic dysfunction of cervical region: Secondary | ICD-10-CM | POA: Diagnosis not present

## 2021-07-17 DIAGNOSIS — M9900 Segmental and somatic dysfunction of head region: Secondary | ICD-10-CM | POA: Diagnosis not present

## 2021-07-17 DIAGNOSIS — M9905 Segmental and somatic dysfunction of pelvic region: Secondary | ICD-10-CM | POA: Diagnosis not present

## 2021-07-22 DIAGNOSIS — M9905 Segmental and somatic dysfunction of pelvic region: Secondary | ICD-10-CM | POA: Diagnosis not present

## 2021-07-22 DIAGNOSIS — M9900 Segmental and somatic dysfunction of head region: Secondary | ICD-10-CM | POA: Diagnosis not present

## 2021-07-22 DIAGNOSIS — M9901 Segmental and somatic dysfunction of cervical region: Secondary | ICD-10-CM | POA: Diagnosis not present

## 2021-07-22 DIAGNOSIS — M9903 Segmental and somatic dysfunction of lumbar region: Secondary | ICD-10-CM | POA: Diagnosis not present

## 2021-07-24 DIAGNOSIS — M9901 Segmental and somatic dysfunction of cervical region: Secondary | ICD-10-CM | POA: Diagnosis not present

## 2021-07-24 DIAGNOSIS — M9905 Segmental and somatic dysfunction of pelvic region: Secondary | ICD-10-CM | POA: Diagnosis not present

## 2021-07-24 DIAGNOSIS — M9900 Segmental and somatic dysfunction of head region: Secondary | ICD-10-CM | POA: Diagnosis not present

## 2021-07-24 DIAGNOSIS — M9903 Segmental and somatic dysfunction of lumbar region: Secondary | ICD-10-CM | POA: Diagnosis not present

## 2021-07-25 DIAGNOSIS — F331 Major depressive disorder, recurrent, moderate: Secondary | ICD-10-CM | POA: Diagnosis not present

## 2021-07-29 DIAGNOSIS — M9900 Segmental and somatic dysfunction of head region: Secondary | ICD-10-CM | POA: Diagnosis not present

## 2021-07-29 DIAGNOSIS — M9903 Segmental and somatic dysfunction of lumbar region: Secondary | ICD-10-CM | POA: Diagnosis not present

## 2021-07-29 DIAGNOSIS — M9901 Segmental and somatic dysfunction of cervical region: Secondary | ICD-10-CM | POA: Diagnosis not present

## 2021-07-29 DIAGNOSIS — M9905 Segmental and somatic dysfunction of pelvic region: Secondary | ICD-10-CM | POA: Diagnosis not present

## 2021-07-31 DIAGNOSIS — M9901 Segmental and somatic dysfunction of cervical region: Secondary | ICD-10-CM | POA: Diagnosis not present

## 2021-07-31 DIAGNOSIS — M9905 Segmental and somatic dysfunction of pelvic region: Secondary | ICD-10-CM | POA: Diagnosis not present

## 2021-07-31 DIAGNOSIS — M9903 Segmental and somatic dysfunction of lumbar region: Secondary | ICD-10-CM | POA: Diagnosis not present

## 2021-07-31 DIAGNOSIS — M9900 Segmental and somatic dysfunction of head region: Secondary | ICD-10-CM | POA: Diagnosis not present

## 2021-08-05 DIAGNOSIS — M9900 Segmental and somatic dysfunction of head region: Secondary | ICD-10-CM | POA: Diagnosis not present

## 2021-08-05 DIAGNOSIS — M9901 Segmental and somatic dysfunction of cervical region: Secondary | ICD-10-CM | POA: Diagnosis not present

## 2021-08-05 DIAGNOSIS — M9905 Segmental and somatic dysfunction of pelvic region: Secondary | ICD-10-CM | POA: Diagnosis not present

## 2021-08-05 DIAGNOSIS — M9903 Segmental and somatic dysfunction of lumbar region: Secondary | ICD-10-CM | POA: Diagnosis not present

## 2021-08-07 DIAGNOSIS — M9900 Segmental and somatic dysfunction of head region: Secondary | ICD-10-CM | POA: Diagnosis not present

## 2021-08-07 DIAGNOSIS — M9903 Segmental and somatic dysfunction of lumbar region: Secondary | ICD-10-CM | POA: Diagnosis not present

## 2021-08-07 DIAGNOSIS — M9905 Segmental and somatic dysfunction of pelvic region: Secondary | ICD-10-CM | POA: Diagnosis not present

## 2021-08-07 DIAGNOSIS — M9901 Segmental and somatic dysfunction of cervical region: Secondary | ICD-10-CM | POA: Diagnosis not present

## 2021-08-12 DIAGNOSIS — M9905 Segmental and somatic dysfunction of pelvic region: Secondary | ICD-10-CM | POA: Diagnosis not present

## 2021-08-12 DIAGNOSIS — M9903 Segmental and somatic dysfunction of lumbar region: Secondary | ICD-10-CM | POA: Diagnosis not present

## 2021-08-12 DIAGNOSIS — M9901 Segmental and somatic dysfunction of cervical region: Secondary | ICD-10-CM | POA: Diagnosis not present

## 2021-08-12 DIAGNOSIS — M9900 Segmental and somatic dysfunction of head region: Secondary | ICD-10-CM | POA: Diagnosis not present

## 2021-08-14 DIAGNOSIS — M9903 Segmental and somatic dysfunction of lumbar region: Secondary | ICD-10-CM | POA: Diagnosis not present

## 2021-08-14 DIAGNOSIS — M9905 Segmental and somatic dysfunction of pelvic region: Secondary | ICD-10-CM | POA: Diagnosis not present

## 2021-08-14 DIAGNOSIS — M9901 Segmental and somatic dysfunction of cervical region: Secondary | ICD-10-CM | POA: Diagnosis not present

## 2021-08-14 DIAGNOSIS — M9900 Segmental and somatic dysfunction of head region: Secondary | ICD-10-CM | POA: Diagnosis not present

## 2021-08-15 DIAGNOSIS — Z23 Encounter for immunization: Secondary | ICD-10-CM | POA: Diagnosis not present

## 2021-08-25 DIAGNOSIS — F331 Major depressive disorder, recurrent, moderate: Secondary | ICD-10-CM | POA: Diagnosis not present

## 2021-09-10 DIAGNOSIS — F331 Major depressive disorder, recurrent, moderate: Secondary | ICD-10-CM | POA: Diagnosis not present

## 2021-09-15 DIAGNOSIS — F319 Bipolar disorder, unspecified: Secondary | ICD-10-CM | POA: Diagnosis not present

## 2021-09-15 DIAGNOSIS — F411 Generalized anxiety disorder: Secondary | ICD-10-CM | POA: Diagnosis not present

## 2021-09-15 DIAGNOSIS — F331 Major depressive disorder, recurrent, moderate: Secondary | ICD-10-CM | POA: Diagnosis not present

## 2021-10-01 DIAGNOSIS — F331 Major depressive disorder, recurrent, moderate: Secondary | ICD-10-CM | POA: Diagnosis not present

## 2021-10-22 DIAGNOSIS — F331 Major depressive disorder, recurrent, moderate: Secondary | ICD-10-CM | POA: Diagnosis not present

## 2021-11-20 DIAGNOSIS — Z1389 Encounter for screening for other disorder: Secondary | ICD-10-CM | POA: Diagnosis not present

## 2021-11-20 DIAGNOSIS — Z1331 Encounter for screening for depression: Secondary | ICD-10-CM | POA: Diagnosis not present

## 2021-11-20 DIAGNOSIS — I10 Essential (primary) hypertension: Secondary | ICD-10-CM | POA: Diagnosis not present

## 2021-11-26 DIAGNOSIS — F331 Major depressive disorder, recurrent, moderate: Secondary | ICD-10-CM | POA: Diagnosis not present

## 2021-12-10 DIAGNOSIS — F331 Major depressive disorder, recurrent, moderate: Secondary | ICD-10-CM | POA: Diagnosis not present

## 2021-12-10 DIAGNOSIS — F319 Bipolar disorder, unspecified: Secondary | ICD-10-CM | POA: Diagnosis not present

## 2021-12-10 DIAGNOSIS — F411 Generalized anxiety disorder: Secondary | ICD-10-CM | POA: Diagnosis not present

## 2021-12-31 DIAGNOSIS — F331 Major depressive disorder, recurrent, moderate: Secondary | ICD-10-CM | POA: Diagnosis not present

## 2022-02-23 DIAGNOSIS — F331 Major depressive disorder, recurrent, moderate: Secondary | ICD-10-CM | POA: Diagnosis not present

## 2022-03-10 DIAGNOSIS — F411 Generalized anxiety disorder: Secondary | ICD-10-CM | POA: Diagnosis not present

## 2022-03-10 DIAGNOSIS — F319 Bipolar disorder, unspecified: Secondary | ICD-10-CM | POA: Diagnosis not present

## 2022-03-10 DIAGNOSIS — F331 Major depressive disorder, recurrent, moderate: Secondary | ICD-10-CM | POA: Diagnosis not present

## 2022-04-02 DIAGNOSIS — F331 Major depressive disorder, recurrent, moderate: Secondary | ICD-10-CM | POA: Diagnosis not present

## 2022-05-14 DIAGNOSIS — F331 Major depressive disorder, recurrent, moderate: Secondary | ICD-10-CM | POA: Diagnosis not present

## 2022-05-26 DIAGNOSIS — I10 Essential (primary) hypertension: Secondary | ICD-10-CM | POA: Diagnosis not present

## 2022-05-26 DIAGNOSIS — Z23 Encounter for immunization: Secondary | ICD-10-CM | POA: Diagnosis not present

## 2022-06-11 DIAGNOSIS — F331 Major depressive disorder, recurrent, moderate: Secondary | ICD-10-CM | POA: Diagnosis not present

## 2022-06-26 DIAGNOSIS — F411 Generalized anxiety disorder: Secondary | ICD-10-CM | POA: Diagnosis not present

## 2022-06-26 DIAGNOSIS — F319 Bipolar disorder, unspecified: Secondary | ICD-10-CM | POA: Diagnosis not present

## 2022-06-26 DIAGNOSIS — F331 Major depressive disorder, recurrent, moderate: Secondary | ICD-10-CM | POA: Diagnosis not present

## 2022-07-29 DIAGNOSIS — F331 Major depressive disorder, recurrent, moderate: Secondary | ICD-10-CM | POA: Diagnosis not present

## 2022-09-16 DIAGNOSIS — F331 Major depressive disorder, recurrent, moderate: Secondary | ICD-10-CM | POA: Diagnosis not present

## 2022-09-22 DIAGNOSIS — F319 Bipolar disorder, unspecified: Secondary | ICD-10-CM | POA: Diagnosis not present

## 2022-09-22 DIAGNOSIS — F331 Major depressive disorder, recurrent, moderate: Secondary | ICD-10-CM | POA: Diagnosis not present

## 2022-09-22 DIAGNOSIS — F411 Generalized anxiety disorder: Secondary | ICD-10-CM | POA: Diagnosis not present

## 2022-11-03 DIAGNOSIS — F331 Major depressive disorder, recurrent, moderate: Secondary | ICD-10-CM | POA: Diagnosis not present

## 2022-12-03 DIAGNOSIS — J309 Allergic rhinitis, unspecified: Secondary | ICD-10-CM | POA: Diagnosis not present

## 2022-12-03 DIAGNOSIS — J4530 Mild persistent asthma, uncomplicated: Secondary | ICD-10-CM | POA: Diagnosis not present

## 2022-12-03 DIAGNOSIS — F319 Bipolar disorder, unspecified: Secondary | ICD-10-CM | POA: Diagnosis not present

## 2022-12-03 DIAGNOSIS — I10 Essential (primary) hypertension: Secondary | ICD-10-CM | POA: Diagnosis not present

## 2022-12-21 DIAGNOSIS — F331 Major depressive disorder, recurrent, moderate: Secondary | ICD-10-CM | POA: Diagnosis not present

## 2022-12-21 DIAGNOSIS — F411 Generalized anxiety disorder: Secondary | ICD-10-CM | POA: Diagnosis not present

## 2022-12-21 DIAGNOSIS — F319 Bipolar disorder, unspecified: Secondary | ICD-10-CM | POA: Diagnosis not present

## 2023-01-28 DIAGNOSIS — F331 Major depressive disorder, recurrent, moderate: Secondary | ICD-10-CM | POA: Diagnosis not present

## 2023-02-26 DIAGNOSIS — F331 Major depressive disorder, recurrent, moderate: Secondary | ICD-10-CM | POA: Diagnosis not present

## 2023-03-18 DIAGNOSIS — F331 Major depressive disorder, recurrent, moderate: Secondary | ICD-10-CM | POA: Diagnosis not present

## 2023-03-18 DIAGNOSIS — F319 Bipolar disorder, unspecified: Secondary | ICD-10-CM | POA: Diagnosis not present

## 2023-03-18 DIAGNOSIS — F411 Generalized anxiety disorder: Secondary | ICD-10-CM | POA: Diagnosis not present

## 2023-05-04 DIAGNOSIS — F331 Major depressive disorder, recurrent, moderate: Secondary | ICD-10-CM | POA: Diagnosis not present

## 2023-06-11 DIAGNOSIS — I10 Essential (primary) hypertension: Secondary | ICD-10-CM | POA: Diagnosis not present

## 2023-06-11 DIAGNOSIS — Z23 Encounter for immunization: Secondary | ICD-10-CM | POA: Diagnosis not present

## 2023-06-16 DIAGNOSIS — F331 Major depressive disorder, recurrent, moderate: Secondary | ICD-10-CM | POA: Diagnosis not present

## 2023-06-22 DIAGNOSIS — F319 Bipolar disorder, unspecified: Secondary | ICD-10-CM | POA: Diagnosis not present

## 2023-06-22 DIAGNOSIS — F411 Generalized anxiety disorder: Secondary | ICD-10-CM | POA: Diagnosis not present

## 2023-06-22 DIAGNOSIS — F331 Major depressive disorder, recurrent, moderate: Secondary | ICD-10-CM | POA: Diagnosis not present

## 2023-09-08 DIAGNOSIS — F331 Major depressive disorder, recurrent, moderate: Secondary | ICD-10-CM | POA: Diagnosis not present

## 2023-10-01 DIAGNOSIS — F319 Bipolar disorder, unspecified: Secondary | ICD-10-CM | POA: Diagnosis not present

## 2023-10-01 DIAGNOSIS — F411 Generalized anxiety disorder: Secondary | ICD-10-CM | POA: Diagnosis not present

## 2023-10-01 DIAGNOSIS — F331 Major depressive disorder, recurrent, moderate: Secondary | ICD-10-CM | POA: Diagnosis not present

## 2023-10-07 DIAGNOSIS — F331 Major depressive disorder, recurrent, moderate: Secondary | ICD-10-CM | POA: Diagnosis not present

## 2023-11-03 DIAGNOSIS — F331 Major depressive disorder, recurrent, moderate: Secondary | ICD-10-CM | POA: Diagnosis not present

## 2023-11-18 DIAGNOSIS — F331 Major depressive disorder, recurrent, moderate: Secondary | ICD-10-CM | POA: Diagnosis not present

## 2023-11-26 DIAGNOSIS — I10 Essential (primary) hypertension: Secondary | ICD-10-CM | POA: Diagnosis not present

## 2023-12-09 DIAGNOSIS — F331 Major depressive disorder, recurrent, moderate: Secondary | ICD-10-CM | POA: Diagnosis not present

## 2023-12-21 DIAGNOSIS — F411 Generalized anxiety disorder: Secondary | ICD-10-CM | POA: Diagnosis not present

## 2024-01-13 DIAGNOSIS — F331 Major depressive disorder, recurrent, moderate: Secondary | ICD-10-CM | POA: Diagnosis not present

## 2024-02-10 DIAGNOSIS — F331 Major depressive disorder, recurrent, moderate: Secondary | ICD-10-CM | POA: Diagnosis not present

## 2024-03-03 ENCOUNTER — Encounter: Payer: Self-pay | Admitting: Advanced Practice Midwife

## 2024-03-13 DIAGNOSIS — F331 Major depressive disorder, recurrent, moderate: Secondary | ICD-10-CM | POA: Diagnosis not present

## 2024-03-17 DIAGNOSIS — F319 Bipolar disorder, unspecified: Secondary | ICD-10-CM | POA: Diagnosis not present

## 2024-03-17 DIAGNOSIS — F411 Generalized anxiety disorder: Secondary | ICD-10-CM | POA: Diagnosis not present

## 2024-03-17 DIAGNOSIS — F331 Major depressive disorder, recurrent, moderate: Secondary | ICD-10-CM | POA: Diagnosis not present

## 2024-04-12 DIAGNOSIS — F331 Major depressive disorder, recurrent, moderate: Secondary | ICD-10-CM | POA: Diagnosis not present

## 2024-05-12 DIAGNOSIS — F331 Major depressive disorder, recurrent, moderate: Secondary | ICD-10-CM | POA: Diagnosis not present

## 2024-05-26 DIAGNOSIS — I10 Essential (primary) hypertension: Secondary | ICD-10-CM | POA: Diagnosis not present

## 2024-05-26 DIAGNOSIS — Z23 Encounter for immunization: Secondary | ICD-10-CM | POA: Diagnosis not present

## 2024-08-01 DIAGNOSIS — F411 Generalized anxiety disorder: Secondary | ICD-10-CM | POA: Diagnosis not present
